# Patient Record
Sex: Female | Born: 2005 | Race: Black or African American | Hispanic: No | Marital: Single | Smoking: Never smoker
Health system: Southern US, Community
[De-identification: ages and names within clinical notes are randomized; demographics above are authoritative.]

## PROBLEM LIST (undated history)

## (undated) DIAGNOSIS — L309 Dermatitis, unspecified: Principal | ICD-10-CM

## (undated) DIAGNOSIS — J45909 Unspecified asthma, uncomplicated: Secondary | ICD-10-CM

## (undated) DIAGNOSIS — N946 Dysmenorrhea, unspecified: Secondary | ICD-10-CM

## (undated) HISTORY — DX: Unspecified asthma, uncomplicated: J45.909

## (undated) HISTORY — PX: WISDOM TOOTH EXTRACTION: SHX21

## (undated) HISTORY — DX: Dermatitis, unspecified: L30.9

## (undated) HISTORY — DX: Dysmenorrhea, unspecified: N94.6

---
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
## 2023-03-24 ENCOUNTER — Encounter: Payer: Medicaid Other | Admitting: Advanced Practice Midwife

## 2023-03-29 DIAGNOSIS — J209 Acute bronchitis, unspecified: Secondary | ICD-10-CM | POA: Diagnosis not present

## 2023-03-29 DIAGNOSIS — J101 Influenza due to other identified influenza virus with other respiratory manifestations: Secondary | ICD-10-CM | POA: Diagnosis not present

## 2023-03-29 DIAGNOSIS — J4521 Mild intermittent asthma with (acute) exacerbation: Secondary | ICD-10-CM | POA: Diagnosis not present

## 2023-03-29 DIAGNOSIS — Z1152 Encounter for screening for COVID-19: Secondary | ICD-10-CM | POA: Diagnosis not present

## 2023-04-26 ENCOUNTER — Encounter: Payer: Medicaid Other | Admitting: Adult Health

## 2023-06-29 ENCOUNTER — Ambulatory Visit

## 2023-07-21 DIAGNOSIS — N766 Ulceration of vulva: Secondary | ICD-10-CM | POA: Diagnosis not present

## 2023-07-21 DIAGNOSIS — A5901 Trichomonal vulvovaginitis: Secondary | ICD-10-CM | POA: Diagnosis not present

## 2023-07-21 DIAGNOSIS — L989 Disorder of the skin and subcutaneous tissue, unspecified: Secondary | ICD-10-CM | POA: Diagnosis not present

## 2023-07-21 DIAGNOSIS — B3731 Acute candidiasis of vulva and vagina: Secondary | ICD-10-CM | POA: Diagnosis not present

## 2023-07-21 DIAGNOSIS — Z113 Encounter for screening for infections with a predominantly sexual mode of transmission: Secondary | ICD-10-CM | POA: Diagnosis not present

## 2023-07-21 DIAGNOSIS — R300 Dysuria: Secondary | ICD-10-CM | POA: Diagnosis not present

## 2023-07-21 DIAGNOSIS — Z3201 Encounter for pregnancy test, result positive: Secondary | ICD-10-CM | POA: Diagnosis not present

## 2023-07-31 DIAGNOSIS — O26891 Other specified pregnancy related conditions, first trimester: Secondary | ICD-10-CM | POA: Diagnosis not present

## 2023-07-31 DIAGNOSIS — Z3A01 Less than 8 weeks gestation of pregnancy: Secondary | ICD-10-CM | POA: Diagnosis not present

## 2023-07-31 DIAGNOSIS — R1030 Lower abdominal pain, unspecified: Secondary | ICD-10-CM | POA: Diagnosis not present

## 2023-08-11 ENCOUNTER — Other Ambulatory Visit: Payer: Self-pay | Admitting: Obstetrics & Gynecology

## 2023-08-11 DIAGNOSIS — O3680X0 Pregnancy with inconclusive fetal viability, not applicable or unspecified: Secondary | ICD-10-CM

## 2023-08-15 ENCOUNTER — Ambulatory Visit

## 2023-08-15 DIAGNOSIS — Z3491 Encounter for supervision of normal pregnancy, unspecified, first trimester: Secondary | ICD-10-CM

## 2023-08-15 DIAGNOSIS — O3680X0 Pregnancy with inconclusive fetal viability, not applicable or unspecified: Secondary | ICD-10-CM

## 2023-08-15 DIAGNOSIS — Z3A01 Less than 8 weeks gestation of pregnancy: Secondary | ICD-10-CM | POA: Diagnosis not present

## 2023-08-15 NOTE — Progress Notes (Signed)
 US  7+6 wks,single IUP with yolk sac,CRL 15.13 mm,normal ovaries,FHR 166 bpm

## 2023-09-05 ENCOUNTER — Telehealth: Payer: Self-pay | Admitting: *Deleted

## 2023-09-05 DIAGNOSIS — Z3400 Encounter for supervision of normal first pregnancy, unspecified trimester: Secondary | ICD-10-CM | POA: Insufficient documentation

## 2023-09-05 NOTE — Telephone Encounter (Signed)
 Pt has a sharp pain in right side of stomach. Pain started this am around 8:00 am. Pt had a BM this morning. Pt is early pregnant. Pt was advised dehydration can cause discomfort. Pt was advised to increase water intake to 6-8 bottles of water a day. If pain don't go away or if it gets worse, let us  know. Pt voiced understanding. JSY

## 2023-09-06 ENCOUNTER — Encounter: Admitting: *Deleted

## 2023-09-06 ENCOUNTER — Ambulatory Visit (INDEPENDENT_AMBULATORY_CARE_PROVIDER_SITE_OTHER): Admitting: Women's Health

## 2023-09-06 ENCOUNTER — Encounter: Payer: Self-pay | Admitting: Women's Health

## 2023-09-06 ENCOUNTER — Other Ambulatory Visit (HOSPITAL_COMMUNITY)
Admission: RE | Admit: 2023-09-06 | Discharge: 2023-09-06 | Disposition: A | Discharge status: Home / Self Care | Source: Ambulatory Visit | Attending: Women's Health | Admitting: Women's Health

## 2023-09-06 VITALS — BP 120/86 | HR 84 | Ht 66.0 in | Wt 191.4 lb

## 2023-09-06 DIAGNOSIS — Z3A11 11 weeks gestation of pregnancy: Secondary | ICD-10-CM

## 2023-09-06 DIAGNOSIS — Z131 Encounter for screening for diabetes mellitus: Secondary | ICD-10-CM

## 2023-09-06 DIAGNOSIS — Z1332 Encounter for screening for maternal depression: Secondary | ICD-10-CM

## 2023-09-06 DIAGNOSIS — Z3401 Encounter for supervision of normal first pregnancy, first trimester: Secondary | ICD-10-CM | POA: Diagnosis not present

## 2023-09-06 DIAGNOSIS — Z6831 Body mass index (BMI) 31.0-31.9, adult: Secondary | ICD-10-CM

## 2023-09-06 DIAGNOSIS — A599 Trichomoniasis, unspecified: Secondary | ICD-10-CM | POA: Diagnosis not present

## 2023-09-06 DIAGNOSIS — B009 Herpesviral infection, unspecified: Secondary | ICD-10-CM | POA: Diagnosis not present

## 2023-09-06 MED ORDER — PROMETHAZINE HCL 25 MG PO TABS: 12.5000 mg | ORAL_TABLET | Freq: Four times a day (QID) | ORAL | 6 refills | Status: DC | PRN

## 2023-09-06 MED ORDER — ASPIRIN 81 MG PO TBEC: 81.0000 mg | DELAYED_RELEASE_TABLET | Freq: Every day | ORAL | 3 refills | Status: AC

## 2023-09-06 MED ORDER — BLOOD PRESSURE MONITOR MISC: 0 refills | Status: AC

## 2023-09-06 NOTE — Progress Notes (Signed)
 INITIAL OBSTETRICAL VISIT Patient name: Miranda White MRN [id number]  Date of birth: Oct 21, 2005 Chief Complaint:   Initial Prenatal Visit (nausea)  History of Present Illness:   Miranda White is a 18 y.o. G1P0 African-American female at [redacted]w[redacted]d by US  at 7 weeks with an Estimated Date of Delivery: 03/27/24 being seen today for her initial obstetrical visit.   Patient's last menstrual period was 06/28/2023. Her obstetrical history is significant for primigravida.   Today she reports nausea, occ vomiting- requests prn meds.  Last pap <21yo. Results were: N/A     09/06/2023    3:30 PM 04/20/2022    9:25 AM  Depression screen PHQ 2/9  Decreased Interest 0 0  Down, Depressed, Hopeless 0 1  PHQ - 2 Score 0 1  Altered sleeping 1 0  Tired, decreased energy 1 0  Change in appetite 0 0  Feeling bad or failure about yourself  0 0  Trouble concentrating 0 0  Moving slowly or fidgety/restless 0 0  Suicidal thoughts 0   PHQ-9 Score 2 1        09/06/2023    3:30 PM  GAD 7 : Generalized Anxiety Score  Nervous, Anxious, on Edge 1  Control/stop worrying 0  Worry too much - different things 1  Trouble relaxing 0  Restless 0  Easily annoyed or irritable 1  Afraid - awful might happen 0  Total GAD 7 Score 3     Review of Systems:   Pertinent items are noted in HPI Denies cramping/contractions, leakage of fluid, vaginal bleeding, abnormal vaginal discharge w/ itching/odor/irritation, headaches, visual changes, shortness of breath, chest pain, abdominal pain, severe nausea/vomiting, or problems with urination or bowel movements unless otherwise stated above.  Pertinent History Reviewed:  Reviewed past medical,surgical, social, obstetrical and family history.  Reviewed problem list, medications and allergies. OB History  Gravida Para Term Preterm AB Living  1       SAB IAB Ectopic Multiple Live Births          # Outcome Date GA Lbr Len/2nd Weight Sex Type Anes PTL Lv   1 Current            Physical Assessment:   Vitals:   09/05/23 1612 09/06/23 1529  BP:  120/86  Pulse:  84  Weight:  191 lb 6.4 oz (86.8 kg)  Height: 5' 6 (1.676 m)   Body mass index is 30.89 kg/m.       Physical Examination:  General appearance - well appearing, and in no distress  Mental status - alert, oriented to person, place, and time  Psych:  She has a normal mood and affect  Skin - warm and dry, normal color, no suspicious lesions noted  Chest - effort normal, all lung fields clear to auscultation bilaterally  Heart - normal rate and regular rhythm  Abdomen - soft, nontender  Extremities:  No swelling or varicosities noted  Thin prep pap is not done   Chaperone: N/A  TODAY'S Informal TA u/s: +FCA and active fetus  No results found for this or any previous visit (from the past 24 hours).  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 Swallow whole.    Dispense:  90 tablet    Refill:  3   promethazine  (PHENERGAN ) 25 MG tablet    Sig: Take 0.5-1 tablets (12.5-25 mg total) by mouth every 6 (six) hours as needed.    Dispense:  30 tablet    Refill:  6    Initial labs obtained Continue prenatal vitamins Reviewed n/v relief measures and warning s/s to report Reviewed recommended weight gain based on pre-gravid BMI Encouraged well-balanced diet Genetic & carrier screening discussed: requests Panorama, declines NT/IT, AFP, and Horizon  Ultrasound discussed; fetal  survey: requested CCNC completed> form faxed if has or is planning to apply for medicaid The nature of [facility] [employer] with multiple MDs and other Advanced Practice Providers was explained to patient; also emphasized that fellows, residents, and students are part of our team. Does not have home bp cuff. Office bp cuff given: no. Rx sent: yes. Check bp weekly, let us  know if consistently >140/90.   Indications for ASA therapy (per uptodate) OR Two or more of the following: Nulliparity Yes Obesity (BMI>30 kg/m2) Yes Sociodemographic characteristics (African American race, low socioeconomic level) Yes  Follow-up: Return in about 4 weeks (around 10/04/2023) for LROB, CNM, in person; then 9wks from now anatomy u/s and LROB w/ CNM.   Orders Placed This Encounter  Procedures   Urine Culture   PANORAMA PRENATAL TEST   Hemoglobin A1c   CBC/D/Plt+RPR+Rh+ABO+RubIgG...   Hgb [address] [place], [facility] 09/06/2023 4:18 PM

## 2023-09-06 NOTE — Patient Instructions (Signed)
 Miranda White, thank you for choosing our office today! We appreciate the opportunity to meet your healthcare needs. You may receive a short survey by mail, e-mail, or through [employer]. If you are happy with your care we would appreciate if you could take just a few minutes to complete the survey questions. We read all of your comments and take your feedback very seriously. Thank you again for choosing our office.  Center for [employer] [facility] Team at [facility]  [facility] at [facility] ([address] [place], [place] [address]) Entrance C, located off of [address] Free 24/7 valet parking   Nausea & Vomiting Have saltine crackers or pretzels by your bed and eat a few bites before you raise your head out of bed in the morning Eat small frequent meals throughout the day instead of large meals Drink plenty of fluids throughout the day to stay hydrated, just don't drink a lot of fluids with your meals.  This can make your stomach fill up faster making you feel sick Do not brush your teeth right after you eat Products with real ginger are good for nausea, like ginger ale and ginger hard candy Make sure it says made with real ginger! Sucking on sour candy like lemon heads is also good for nausea If your prenatal vitamins make you nauseated, take them at night so you will sleep through the nausea Sea Bands If you feel like you need medicine for the nausea & vomiting please let us  know If you are unable to keep any fluids or food down please let us  know   Constipation Drink plenty of fluid, preferably water, throughout the day Eat foods high in fiber such as fruits, vegetables, and grains Exercise, such as walking, is a good way to keep your bowels regular Drink warm fluids, especially warm prune juice, or decaf coffee Eat a 1/2 cup of real oatmeal (not instant), 1/2 cup applesauce, and 1/2-1 cup warm prune juice every day If needed, you may take Colace (docusate sodium) stool  softener once or twice a day to help keep the stool soft.  If you still are having problems with constipation, you may take Miralax  once daily as needed to help keep your bowels regular.   Home Blood Pressure Monitoring for Patients   Your provider has recommended that you check your blood pressure (BP) at least once a week at home. If you do not have a blood pressure cuff at home, one will be provided for you. Contact your provider if you have not received your monitor within 1 week.   Helpful Tips for Accurate Home Blood Pressure Checks  Don't smoke, exercise, or drink caffeine 30 minutes before checking your BP Use the restroom before checking your BP (a full bladder can raise your pressure) Relax in a comfortable upright chair Feet on the ground Left arm resting comfortably on a flat surface at the level of your heart Legs uncrossed Back supported Sit quietly and don't talk Place the cuff on your bare arm Adjust snuggly, so that only two fingertips can fit between your skin and the top of the cuff Check 2 readings separated by at least one minute Keep a log of your BP readings For a visual, please reference this diagram: http://ccnc.care/bpdiagram  Provider Name: [facility]     Phone: [phone]  Zone 1: ALL CLEAR  Continue to monitor your symptoms:  BP reading is less than 140 (top number) or less than 90 (bottom  number)  No right upper stomach pain No headaches or seeing spots No feeling nauseated or throwing up No swelling in face and hands  Zone 2: CAUTION Call your doctor's office for any of the following:  BP reading is greater than 140 (top number) or greater than 90 (bottom number)  Stomach pain under your ribs in the middle or right side Headaches or seeing spots Feeling nauseated or throwing up Swelling in face and hands  Zone 3: EMERGENCY  Seek immediate medical care if you have any of the following:  BP reading is greater than160 (top number) or  greater than 110 (bottom number) Severe headaches not improving with Tylenol Serious difficulty catching your breath Any worsening symptoms from Zone 2    First Trimester of Pregnancy The first trimester of pregnancy is from week 1 until the end of week 12 (months 1 through 3). A week after a sperm fertilizes an egg, the egg will implant on the wall of the uterus. This embryo will begin to develop into a baby. Genes from you and your partner are forming the baby. The female genes determine whether the baby is a boy or a girl. At 6-8 weeks, the eyes and face are formed, and the heartbeat can be seen on ultrasound. At the end of 12 weeks, all the baby's organs are formed.  Now that you are pregnant, you will want to do everything you can to have a healthy baby. Two of the most important things are to get good prenatal care and to follow your health care provider's instructions. Prenatal care is all the medical care you receive before the baby's birth. This care will help prevent, find, and treat any problems during the pregnancy and childbirth. BODY CHANGES Your body goes through many changes during pregnancy. The changes vary from woman to woman.  You may gain or lose a couple of pounds at first. You may feel sick to your stomach (nauseous) and throw up (vomit). If the vomiting is uncontrollable, call your health care provider. You may tire easily. You may develop headaches that can be relieved by medicines approved by your health care provider. You may urinate more often. Painful urination may mean you have a bladder infection. You may develop heartburn as a result of your pregnancy. You may develop constipation because certain hormones are causing the muscles that push waste through your intestines to slow down. You may develop hemorrhoids or swollen, bulging veins (varicose veins). Your breasts may begin to grow larger and become tender. Your nipples may stick out more, and the tissue that  surrounds them (areola) may become darker. Your gums may bleed and may be sensitive to brushing and flossing. Dark spots or blotches (chloasma, mask of pregnancy) may develop on your face. This will likely fade after the baby is born. Your menstrual periods will stop. You may have a loss of appetite. You may develop cravings for certain kinds of food. You may have changes in your emotions from day to day, such as being excited to be pregnant or being concerned that something may go wrong with the pregnancy and baby. You may have more vivid and strange dreams. You may have changes in your hair. These can include thickening of your hair, rapid growth, and changes in texture. Some women also have hair loss during or after pregnancy, or hair that feels dry or thin. Your hair will most likely return to normal after your baby is born. WHAT TO EXPECT AT YOUR PRENATAL  VISITS During a routine prenatal visit: You will be weighed to make sure you and the baby are growing normally. Your blood pressure will be taken. Your abdomen will be measured to track your baby's growth. The fetal heartbeat will be listened to starting around week 10 or 12 of your pregnancy. Test results from any previous visits will be discussed. Your health care provider may ask you: How you are feeling. If you are feeling the baby move. If you have had any abnormal symptoms, such as leaking fluid, bleeding, severe headaches, or abdominal cramping. If you have any questions. Other tests that may be performed during your first trimester include: Blood tests to find your blood type and to check for the presence of any previous infections. They will also be used to check for low iron levels (anemia) and Rh antibodies. Later in the pregnancy, blood tests for diabetes will be done along with other tests if problems develop. Urine tests to check for infections, diabetes, or protein in the urine. An ultrasound to confirm the proper growth  and development of the baby. An amniocentesis to check for possible genetic problems. Fetal screens for spina bifida and Down syndrome. You may need other tests to make sure you and the baby are doing well. HOME CARE INSTRUCTIONS  Medicines Follow your health care provider's instructions regarding medicine use. Specific medicines may be either safe or unsafe to take during pregnancy. Take your prenatal vitamins as directed. If you develop constipation, try taking a stool softener if your health care provider approves. Diet Eat regular, well-balanced meals. Choose a variety of foods, such as meat or vegetable-based protein, fish, milk and low-fat dairy products, vegetables, fruits, and whole grain breads and cereals. Your health care provider will help you determine the amount of weight gain that is right for you. Avoid raw meat and uncooked cheese. These carry germs that can cause birth defects in the baby. Eating four or five small meals rather than three large meals a day may help relieve nausea and vomiting. If you start to feel nauseous, eating a few soda crackers can be helpful. Drinking liquids between meals instead of during meals also seems to help nausea and vomiting. If you develop constipation, eat more high-fiber foods, such as fresh vegetables or fruit and whole grains. Drink enough fluids to keep your urine clear or pale yellow. Activity and Exercise Exercise only as directed by your health care provider. Exercising will help you: Control your weight. Stay in shape. Be prepared for labor and delivery. Experiencing pain or cramping in the lower abdomen or low back is a good sign that you should stop exercising. Check with your health care provider before continuing normal exercises. Try to avoid standing for long periods of time. Move your legs often if you must stand in one place for a long time. Avoid heavy lifting. Wear low-heeled shoes, and practice good posture. You may  continue to have sex unless your health care provider directs you otherwise. Relief of Pain or Discomfort Wear a good support bra for breast tenderness.   Take warm sitz baths to soothe any pain or discomfort caused by hemorrhoids. Use hemorrhoid cream if your health care provider approves.   Rest with your legs elevated if you have leg cramps or low back pain. If you develop varicose veins in your legs, wear support hose. Elevate your feet for 15 minutes, 3-4 times a day. Limit salt in your diet. Prenatal Care Schedule your prenatal visits by the  twelfth week of pregnancy. They are usually scheduled monthly at first, then more often in the last 2 months before delivery. Write down your questions. Take them to your prenatal visits. Keep all your prenatal visits as directed by your health care provider. Safety Wear your seat belt at all times when driving. Make a list of emergency phone numbers, including numbers for family, friends, the hospital, and police and fire departments. General Tips Ask your health care provider for a referral to a local prenatal education class. Begin classes no later than at the beginning of month 6 of your pregnancy. Ask for help if you have counseling or nutritional needs during pregnancy. Your health care provider can offer advice or refer you to specialists for help with various needs. Do not use hot tubs, steam rooms, or saunas. Do not douche or use tampons or scented sanitary pads. Do not cross your legs for long periods of time. Avoid cat litter boxes and soil used by cats. These carry germs that can cause birth defects in the baby and possibly loss of the fetus by miscarriage or stillbirth. Avoid all smoking, herbs, alcohol, and medicines not prescribed by your health care provider. Chemicals in these affect the formation and growth of the baby. Schedule a dentist appointment. At home, brush your teeth with a soft toothbrush and be gentle when you floss. SEEK  MEDICAL CARE IF:  You have dizziness. You have mild pelvic cramps, pelvic pressure, or nagging pain in the abdominal area. You have persistent nausea, vomiting, or diarrhea. You have a bad smelling vaginal discharge. You have pain with urination. You notice increased swelling in your face, hands, legs, or ankles. SEEK IMMEDIATE MEDICAL CARE IF:  You have a fever. You are leaking fluid from your vagina. You have spotting or bleeding from your vagina. You have severe abdominal cramping or pain. You have rapid weight gain or loss. You vomit blood or material that looks like coffee grounds. You are exposed to [place] measles and have never had them. You are exposed to fifth disease or chickenpox. You develop a severe headache. You have shortness of breath. You have any kind of trauma, such as from a fall or a car accident. Document Released: 01/19/2001 Document Revised: 06/11/2013 Document Reviewed: 12/05/2012 [facility] Patient Information 2015 [place], [place]. This information is not intended to replace advice given to you by your health care provider. Make sure you discuss any questions you have with your health care provider.

## 2023-09-07 ENCOUNTER — Encounter: Payer: Self-pay | Admitting: Women's Health

## 2023-09-07 DIAGNOSIS — N390 Urinary tract infection, site not specified: Secondary | ICD-10-CM | POA: Insufficient documentation

## 2023-09-08 LAB — HGB FRACTIONATION CASCADE
Hgb A2: 2.5 (ref 1.8–3.2)
Hgb A: 97.5 (ref 96.4–98.8)
Hgb F: 0 (ref 0.0–2.0)
Hgb S: 0

## 2023-09-08 LAB — CERVICOVAGINAL ANCILLARY ONLY
Chlamydia: POSITIVE — AB
Comment: NEGATIVE
Comment: NEGATIVE
Comment: NORMAL
Neisseria Gonorrhea: NEGATIVE
Trichomonas: POSITIVE — AB

## 2023-09-08 LAB — HEMOGLOBIN A1C
Est. average glucose Bld gHb Est-mCnc: 111 mg/dL
Hgb A1c MFr Bld: 5.5 % (ref 4.8–5.6)

## 2023-09-08 LAB — CBC/D/PLT+RPR+RH+ABO+RUBIGG...
Antibody Screen: NEGATIVE
Basophils Absolute: 0.1 x10E3/uL (ref 0.0–0.2)
Basos: 1 %
EOS (ABSOLUTE): 0.1 x10E3/uL (ref 0.0–0.4)
Eos: 1 %
HCV Ab: NONREACTIVE
HIV Screen 4th Generation wRfx: NONREACTIVE
Hematocrit: 39.7 % (ref 34.0–46.6)
Hemoglobin: 12.7 g/dL (ref 11.1–15.9)
Hepatitis B Surface Ag: NEGATIVE
Immature Grans (Abs): 0 x10E3/uL (ref 0.0–0.1)
Immature Granulocytes: 0 %
Lymphocytes Absolute: 3.2 x10E3/uL — ABNORMAL HIGH (ref 0.7–3.1)
Lymphs: 34 %
MCH: 30 pg (ref 26.6–33.0)
MCHC: 32 g/dL (ref 31.5–35.7)
MCV: 94 fL (ref 79–97)
Monocytes Absolute: 0.8 x10E3/uL (ref 0.1–0.9)
Monocytes: 9 %
Neutrophils Absolute: 5.3 x10E3/uL (ref 1.4–7.0)
Neutrophils: 55 %
Platelets: 411 x10E3/uL (ref 150–450)
RBC: 4.23 x10E6/uL (ref 3.77–5.28)
RDW: 12.9 % (ref 11.7–15.4)
RPR Ser Ql: NONREACTIVE
Rh Factor: POSITIVE
Rubella Antibodies, IGG: 4.14 {index} (ref >0.99)
WBC: 9.6 x10E3/uL (ref 3.4–10.8)

## 2023-09-08 LAB — URINE CULTURE

## 2023-09-08 LAB — HCV INTERPRETATION

## 2023-09-09 ENCOUNTER — Ambulatory Visit: Payer: Self-pay | Admitting: Adult Health

## 2023-09-09 DIAGNOSIS — Z3401 Encounter for supervision of normal first pregnancy, first trimester: Secondary | ICD-10-CM

## 2023-09-12 DIAGNOSIS — Z3480 Encounter for supervision of other normal pregnancy, unspecified trimester: Secondary | ICD-10-CM | POA: Diagnosis not present

## 2023-09-12 LAB — PANORAMA PRENATAL TEST FULL PANEL:PANORAMA TEST PLUS 5 ADDITIONAL MICRODELETIONS: FETAL FRACTION: 3.6

## 2023-10-01 DIAGNOSIS — R079 Chest pain, unspecified: Secondary | ICD-10-CM | POA: Diagnosis not present

## 2023-10-01 DIAGNOSIS — R42 Dizziness and giddiness: Secondary | ICD-10-CM | POA: Diagnosis not present

## 2023-10-01 DIAGNOSIS — Z3A14 14 weeks gestation of pregnancy: Secondary | ICD-10-CM | POA: Diagnosis not present

## 2023-10-01 DIAGNOSIS — R519 Headache, unspecified: Secondary | ICD-10-CM | POA: Diagnosis not present

## 2023-10-01 DIAGNOSIS — R4781 Slurred speech: Secondary | ICD-10-CM | POA: Diagnosis not present

## 2023-10-01 DIAGNOSIS — O26891 Other specified pregnancy related conditions, first trimester: Secondary | ICD-10-CM | POA: Diagnosis not present

## 2023-10-01 NOTE — ED Notes (Signed)
 ED Procedure Note  EKG Interpretation  Date/Time: 10/01/2023 9:39 PM  Performed by: [place] Norleen Agent, MD Authorized by: [place] Norleen Agent, MD   ECG interpreted by ED Physician in the absence of a cardiologist: yes   Previous ECG:    Previous ECG:  Unavailable Interpretation:    Interpretation: normal   Rate:    ECG rate:  95   ECG rate assessment: normal   Rhythm:    Rhythm: sinus rhythm   Ectopy:    Ectopy: none   QRS:    QRS axis:  Normal   QRS intervals:  Normal   QRS conduction: normal   ST segments:    ST segments:  Normal T waves:    T waves: normal   Q waves:    Abnormal Q-waves: not present   Comments:     Sinus rhythm at 95 RSR prime pattern no acute ST-T wave changes normal intervals

## 2023-10-04 ENCOUNTER — Encounter: Payer: Self-pay | Admitting: Women's Health

## 2023-10-04 ENCOUNTER — Ambulatory Visit: Admitting: Women's Health

## 2023-10-04 VITALS — BP 117/77 | HR 66 | Wt 193.0 lb

## 2023-10-04 DIAGNOSIS — A749 Chlamydial infection, unspecified: Secondary | ICD-10-CM | POA: Diagnosis not present

## 2023-10-04 DIAGNOSIS — Z3402 Encounter for supervision of normal first pregnancy, second trimester: Secondary | ICD-10-CM | POA: Diagnosis not present

## 2023-10-04 DIAGNOSIS — A599 Trichomoniasis, unspecified: Secondary | ICD-10-CM

## 2023-10-04 DIAGNOSIS — Z363 Encounter for antenatal screening for malformations: Secondary | ICD-10-CM

## 2023-10-04 DIAGNOSIS — Z3A15 15 weeks gestation of pregnancy: Secondary | ICD-10-CM

## 2023-10-04 MED ORDER — METRONIDAZOLE 500 MG PO TABS
500.0000 mg | ORAL_TABLET | Freq: Two times a day (BID) | ORAL | 0 refills | Status: DC
Start: 2023-10-04 — End: 2023-11-03

## 2023-10-04 MED ORDER — AZITHROMYCIN 500 MG PO TABS: 1000.0000 mg | ORAL_TABLET | Freq: Once | ORAL | 0 refills | Status: AC

## 2023-10-04 NOTE — Progress Notes (Signed)
 LOW-RISK PREGNANCY VISIT Patient name: Miranda White MRN [id number]  Date of birth: 2005-11-06 Chief Complaint:   Routine Prenatal Visit  History of Present Illness:   Miranda White is a 18 y.o. G1P0 female at [redacted]w[redacted]d with an Estimated Date of Delivery: 03/27/24 being seen today for ongoing management of a low-risk pregnancy.   Today she reports didn't take meds for CT/trich. No longer w/ partner, did tell him she was + for both- says he made appt for tx. Contractions: Not present. Vag. Bleeding: None.  Movement: Present. denies leaking of fluid.     09/06/2023    3:30 PM 04/20/2022    9:25 AM  Depression screen PHQ 2/9  Decreased Interest 0 0  Down, Depressed, Hopeless 0 1  PHQ - 2 Score 0 1  Altered sleeping 1 0  Tired, decreased energy 1 0  Change in appetite 0 0  Feeling bad or failure about yourself  0 0  Trouble concentrating 0 0  Moving slowly or fidgety/restless 0 0  Suicidal thoughts 0   PHQ-9 Score 2 1        09/06/2023    3:30 PM  GAD 7 : Generalized Anxiety Score  Nervous, Anxious, on Edge 1  Control/stop worrying 0  Worry too much - different things 1  Trouble relaxing 0  Restless 0  Easily annoyed or irritable 1  Afraid - awful might happen 0  Total GAD 7 Score 3      Review of Systems:   Pertinent items are noted in HPI Denies abnormal vaginal discharge w/ itching/odor/irritation, headaches, visual changes, shortness of breath, chest pain, abdominal pain, severe nausea/vomiting, or problems with urination or bowel movements unless otherwise stated above. Pertinent History Reviewed:  Reviewed past medical,surgical, social, obstetrical and family history.  Reviewed problem list, medications and allergies. Physical Assessment:   Vitals:   10/04/23 1354  BP: 117/77  Pulse: 66  Weight: 193 lb (87.5 kg)  Body mass index is 31.15 kg/m.        Physical Examination:   General appearance: Well appearing, and in no distress  Mental  status: Alert, oriented to person, place, and time  Skin: Warm & dry  Cardiovascular: Normal heart rate noted  Respiratory: Normal respiratory effort, no distress  Abdomen: Soft, gravid, nontender  Pelvic: Cervical exam deferred         Extremities:    Fetal Status: Fetal Heart Rate (bpm): 153   Movement: Present    Chaperone: N/A No results found for this or any previous visit (from the past 24 hours).  Assessment & Plan:  1) Low-risk pregnancy G1P0 at [redacted]w[redacted]d with an Estimated Date of Delivery: 03/27/24   2) Recent +CT and trich, didn't get meds (said pharmacy didn't fill), new rx's sent, partner made appt for tx (no longer together), plan POC in 4wks/upcoming appt   Meds:  Meds ordered this encounter  Medications   azithromycin  (ZITHROMAX ) 500 MG tablet    Sig: Take 2 tablets (1,000 mg total) by mouth once for 1 dose.    Dispense:  2 tablet    Refill:  0   metroNIDAZOLE  (FLAGYL ) 500 MG tablet    Sig: Take 1 tablet (500 mg total) by mouth 2 (two) times daily.    Dispense:  14 tablet    Refill:  0   Labs/procedures today: none  Plan:  Continue routine obstetrical care  Next visit: prefers will be in person for u/s    Reviewed:  Preterm labor symptoms and general obstetric precautions including but not limited to vaginal bleeding, contractions, leaking of fluid and fetal movement were reviewed in detail with the patient.  All questions were answered. Does have home bp cuff. Office bp cuff given: not applicable. Check bp weekly, let us  know if consistently >140 and/or >90.  Follow-up: Return for As scheduled.  Future Appointments  Date Time Provider Department Center  11/03/2023  2:15 PM [facility]  [facility] None  11/03/2023  3:10 PM Kizzie Suzen SAUNDERS, CNM [facility]    Orders Placed This Encounter  Procedures   US  OB Comp + [address] CNM, [facility] 10/04/2023 2:24 PM

## 2023-10-04 NOTE — Patient Instructions (Signed)
 Miranda White, thank you for choosing our office today! We appreciate the opportunity to meet your healthcare needs. You may receive a short survey by mail, e-mail, or through [employer]. If you are happy with your care we would appreciate if you could take just a few minutes to complete the survey questions. We read all of your comments and take your feedback very seriously. Thank you again for choosing our office.  Center for [employer] Team at [facility] at [facility] ([address] [place], [place] [address]) Entrance C, located off of [address] Free 24/7 valet parking  Go to [email or web address] to register for FREE online childbirth classes  Call the office [phone]) or go to [facility] if: You begin to severe cramping Your water breaks.  Sometimes it is a big gush of fluid, sometimes it is just a trickle that keeps getting your panties wet or running down your legs You have vaginal bleeding.  It is normal to have a small amount of spotting if your cervix was checked.   [facility] Pediatricians/Family Doctors [facility]): [address] [place], [phone]           [facility]: [address], [phone]                [facility]): [address], [phone] (call to ask if accepting patients) [facility] Department: [address], [place], [phone]    [facility] Pediatricians/Family Doctors [facility]): [phone] [address], [phone] [facility]: [address] [place], [phone] [facility]: [address], [phone]  [facility] Doctors  [facility]): [phone] [facility]: [address], [phone]   [facility]: [address], [phone]  [facility] Doctors  [facility]: [phone], [phone]  Home Blood Pressure Monitoring for Patients   Your provider has recommended that you check your blood pressure (BP) at least once a week at home. If you do not have a blood pressure cuff at home, one will be provided for you. Contact your provider if you have not received your monitor within 1 week.   Helpful Tips for Accurate Home Blood Pressure Checks  Don't smoke, exercise, or drink caffeine 30 minutes before checking your BP Use the restroom before checking your BP (a full bladder can raise your pressure) Relax in a comfortable upright chair Feet on the ground Left arm resting comfortably on a flat surface at the level of your heart Legs uncrossed Back supported Sit quietly and don't talk Place the cuff on your bare arm Adjust snuggly, so that only two fingertips can fit between your skin and the top of the cuff Check 2 readings separated by at least one minute Keep a log of your BP readings For a visual, please reference this diagram: http://ccnc.care/bpdiagram  Provider Name: [facility]     Phone: [phone]  Zone 1: ALL CLEAR  Continue to monitor your symptoms:  BP reading is less than 140 (top number) or less than 90 (bottom number)  No right upper stomach pain No headaches or seeing spots No feeling nauseated or throwing up No swelling in face and hands  Zone 2: CAUTION Call your doctor's office for any of the following:  BP reading is greater than 140 (top number) or greater than  90 (bottom number)  Stomach pain under your ribs in the middle or right side Headaches or seeing spots Feeling nauseated or throwing up Swelling in face and hands  Zone 3: EMERGENCY  Seek immediate medical care if you have any of the following:  BP reading is greater than160 (top number) or greater than 110 (bottom number) Severe headaches not improving with Tylenol Serious difficulty catching your breath Any worsening symptoms from  Zone 2     Second Trimester of Pregnancy The second trimester is from week 14 through week 27 (months 4 through 6). The second trimester is often a time when you feel your best. Your body has adjusted to being pregnant, and you begin to feel better physically. Usually, morning sickness has lessened or quit completely, you may have more energy, and you may have an increase in appetite. The second trimester is also a time when the fetus is growing rapidly. At the end of the sixth month, the fetus is about 9 inches long and weighs about 1 pounds. You will likely begin to feel the baby move (quickening) between 16 and 20 weeks of pregnancy. Body changes during your second trimester Your body continues to go through many changes during your second trimester. The changes vary from woman to woman. Your weight will continue to increase. You will notice your lower abdomen bulging out. You may begin to get stretch marks on your hips, abdomen, and breasts. You may develop headaches that can be relieved by medicines. The medicines should be approved by your health care provider. You may urinate more often because the fetus is pressing on your bladder. You may develop or continue to have heartburn as a result of your pregnancy. You may develop constipation because certain hormones are causing the muscles that push waste through your intestines to slow down. You may develop hemorrhoids or swollen, bulging veins (varicose veins). You may have back pain. This is caused by: Weight gain. Pregnancy hormones that are relaxing the joints in your pelvis. A shift in weight and the muscles that support your balance. Your breasts will continue to grow and they will continue to become tender. Your gums may bleed and may be sensitive to brushing and flossing. Dark spots or blotches (chloasma, mask of pregnancy) may develop on your face. This will likely fade after the baby is born. A dark line from your belly button to  the pubic area (linea nigra) may appear. This will likely fade after the baby is born. You may have changes in your hair. These can include thickening of your hair, rapid growth, and changes in texture. Some women also have hair loss during or after pregnancy, or hair that feels dry or thin. Your hair will most likely return to normal after your baby is born.  What to expect at prenatal visits During a routine prenatal visit: You will be weighed to make sure you and the fetus are growing normally. Your blood pressure will be taken. Your abdomen will be measured to track your baby's growth. The fetal heartbeat will be listened to. Any test results from the previous visit will be discussed.  Your health care provider may ask you: How you are feeling. If you are feeling the baby move. If you have had any abnormal symptoms, such as leaking fluid, bleeding, severe headaches, or abdominal cramping. If you are using any tobacco products, including cigarettes, chewing tobacco, and electronic cigarettes. If you have any questions.  Other tests that may be performed during  your second trimester include: Blood tests that check for: Low iron levels (anemia). High blood sugar that affects pregnant women (gestational diabetes) between 92 and 28 weeks. Rh antibodies. This is to check for a protein on red blood cells (Rh factor). Urine tests to check for infections, diabetes, or protein in the urine. An ultrasound to confirm the proper growth and development of the baby. An amniocentesis to check for possible genetic problems. Fetal screens for spina bifida and Down syndrome. HIV (human immunodeficiency virus) testing. Routine prenatal testing includes screening for HIV, unless you choose not to have this test.  Follow these instructions at home: Medicines Follow your health care provider's instructions regarding medicine use. Specific medicines may be either safe or unsafe to take during  pregnancy. Take a prenatal vitamin that contains at least 600 micrograms (mcg) of folic acid. If you develop constipation, try taking a stool softener if your health care provider approves. Eating and drinking Eat a balanced diet that includes fresh fruits and vegetables, whole grains, good sources of protein such as meat, eggs, or tofu, and low-fat dairy. Your health care provider will help you determine the amount of weight gain that is right for you. Avoid raw meat and uncooked cheese. These carry germs that can cause birth defects in the baby. If you have low calcium intake from food, talk to your health care provider about whether you should take a daily calcium supplement. Limit foods that are high in fat and processed sugars, such as fried and sweet foods. To prevent constipation: Drink enough fluid to keep your urine clear or pale yellow. Eat foods that are high in fiber, such as fresh fruits and vegetables, whole grains, and beans. Activity Exercise only as directed by your health care provider. Most women can continue their usual exercise routine during pregnancy. Try to exercise for 30 minutes at least 5 days a week. Stop exercising if you experience uterine contractions. Avoid heavy lifting, wear low heel shoes, and practice good posture. A sexual relationship may be continued unless your health care provider directs you otherwise. Relieving pain and discomfort Wear a good support bra to prevent discomfort from breast tenderness. Take warm sitz baths to soothe any pain or discomfort caused by hemorrhoids. Use hemorrhoid cream if your health care provider approves. Rest with your legs elevated if you have leg cramps or low back pain. If you develop varicose veins, wear support hose. Elevate your feet for 15 minutes, 3-4 times a day. Limit salt in your diet. Prenatal Care Write down your questions. Take them to your prenatal visits. Keep all your prenatal visits as told by your health  care provider. This is important. Safety Wear your seat belt at all times when driving. Make a list of emergency phone numbers, including numbers for family, friends, the hospital, and police and fire departments. General instructions Ask your health care provider for a referral to a local prenatal education class. Begin classes no later than the beginning of month 6 of your pregnancy. Ask for help if you have counseling or nutritional needs during pregnancy. Your health care provider can offer advice or refer you to specialists for help with various needs. Do not use hot tubs, steam rooms, or saunas. Do not douche or use tampons or scented sanitary pads. Do not cross your legs for long periods of time. Avoid cat litter boxes and soil used by cats. These carry germs that can cause birth defects in the baby and possibly loss of the  fetus by miscarriage or stillbirth. Avoid all smoking, herbs, alcohol, and unprescribed drugs. Chemicals in these products can affect the formation and growth of the baby. Do not use any products that contain nicotine or tobacco, such as cigarettes and e-cigarettes. If you need help quitting, ask your health care provider. Visit your dentist if you have not gone yet during your pregnancy. Use a soft toothbrush to brush your teeth and be gentle when you floss. Contact a health care provider if: You have dizziness. You have mild pelvic cramps, pelvic pressure, or nagging pain in the abdominal area. You have persistent nausea, vomiting, or diarrhea. You have a bad smelling vaginal discharge. You have pain when you urinate. Get help right away if: You have a fever. You are leaking fluid from your vagina. You have spotting or bleeding from your vagina. You have severe abdominal cramping or pain. You have rapid weight gain or weight loss. You have shortness of breath with chest pain. You notice sudden or extreme swelling of your face, hands, ankles, feet, or legs. You  have not felt your baby move in over an hour. You have severe headaches that do not go away when you take medicine. You have vision changes. Summary The second trimester is from week 14 through week 27 (months 4 through 6). It is also a time when the fetus is growing rapidly. Your body goes through many changes during pregnancy. The changes vary from woman to woman. Avoid all smoking, herbs, alcohol, and unprescribed drugs. These chemicals affect the formation and growth your baby. Do not use any tobacco products, such as cigarettes, chewing tobacco, and e-cigarettes. If you need help quitting, ask your health care provider. Contact your health care provider if you have any questions. Keep all prenatal visits as told by your health care provider. This is important. This information is not intended to replace advice given to you by your health care provider. Make sure you discuss any questions you have with your health care provider. Document Released: 01/19/2001 Document Revised: 07/03/2015 Document Reviewed: 03/28/2012 Elsevier Interactive Patient Education  2017 [employer].

## 2023-10-17 ENCOUNTER — Encounter: Payer: Self-pay | Admitting: Women's Health

## 2023-10-28 ENCOUNTER — Encounter: Payer: Self-pay | Admitting: *Deleted

## 2023-11-03 ENCOUNTER — Encounter: Payer: Self-pay | Admitting: Women's Health

## 2023-11-03 ENCOUNTER — Other Ambulatory Visit (HOSPITAL_COMMUNITY)
Admission: RE | Admit: 2023-11-03 | Discharge: 2023-11-03 | Disposition: A | Discharge status: Home / Self Care | Source: Ambulatory Visit | Attending: Women's Health | Admitting: Women's Health

## 2023-11-03 ENCOUNTER — Ambulatory Visit

## 2023-11-03 ENCOUNTER — Ambulatory Visit (INDEPENDENT_AMBULATORY_CARE_PROVIDER_SITE_OTHER): Admitting: Women's Health

## 2023-11-03 VITALS — BP 99/66 | HR 77 | Wt 197.0 lb

## 2023-11-03 DIAGNOSIS — Z3402 Encounter for supervision of normal first pregnancy, second trimester: Secondary | ICD-10-CM

## 2023-11-03 DIAGNOSIS — Z3A19 19 weeks gestation of pregnancy: Secondary | ICD-10-CM

## 2023-11-03 DIAGNOSIS — A599 Trichomoniasis, unspecified: Secondary | ICD-10-CM | POA: Insufficient documentation

## 2023-11-03 DIAGNOSIS — A749 Chlamydial infection, unspecified: Secondary | ICD-10-CM | POA: Insufficient documentation

## 2023-11-03 DIAGNOSIS — Z363 Encounter for antenatal screening for malformations: Secondary | ICD-10-CM

## 2023-11-03 NOTE — Progress Notes (Signed)
 US  19+2 wks,cephalic,posterior placenta gr 0,normal ovaries,CX 3.4 cm,SVP of fluid 4 cm,FHR 146 bpm,two LVEICF,EFW 286 g 46%,anatomy complete

## 2023-11-03 NOTE — Patient Instructions (Signed)
 Miranda White, thank you for choosing our office today! We appreciate the opportunity to meet your healthcare needs. You may receive a short survey by mail, e-mail, or through [employer]. If you are happy with your care we would appreciate if you could take just a few minutes to complete the survey questions. We read all of your comments and take your feedback very seriously. Thank you again for choosing our office.  Center for [employer] Team at [facility] at [facility] ([address] [place], [place] [address]) Entrance C, located off of [address] Free 24/7 valet parking  Go to [email or web address] to register for FREE online childbirth classes  Call the office [phone]) or go to [facility] if: You begin to severe cramping Your water breaks.  Sometimes it is a big gush of fluid, sometimes it is just a trickle that keeps getting your panties wet or running down your legs You have vaginal bleeding.  It is normal to have a small amount of spotting if your cervix was checked.   [facility] Pediatricians/Family Doctors [facility]): [address] [place], [phone]           [facility]: [address], [phone]                [facility]): [address], [phone] (call to ask if accepting patients) [facility] Department: [address], [place], [phone]    [facility] Pediatricians/Family Doctors [facility]): [phone] [address], [phone] [facility]: [address] [place], [phone] [facility]: [address], [phone]  [facility] Doctors  [facility]): [phone] [facility]: [address], [phone]   [facility]: [address], [phone]  [facility] Doctors  [facility]: [phone], [phone]  Home Blood Pressure Monitoring for Patients   Your provider has recommended that you check your blood pressure (BP) at least once a week at home. If you do not have a blood pressure cuff at home, one will be provided for you. Contact your provider if you have not received your monitor within 1 week.   Helpful Tips for Accurate Home Blood Pressure Checks  Don't smoke, exercise, or drink caffeine 30 minutes before checking your BP Use the restroom before checking your BP (a full bladder can raise your pressure) Relax in a comfortable upright chair Feet on the ground Left arm resting comfortably on a flat surface at the level of your heart Legs uncrossed Back supported Sit quietly and don't talk Place the cuff on your bare arm Adjust snuggly, so that only two fingertips can fit between your skin and the top of the cuff Check 2 readings separated by at least one minute Keep a log of your BP readings For a visual, please reference this diagram: http://ccnc.care/bpdiagram  Provider Name: [facility]     Phone: [phone]  Zone 1: ALL CLEAR  Continue to monitor your symptoms:  BP reading is less than 140 (top number) or less than 90 (bottom number)  No right upper stomach pain No headaches or seeing spots No feeling nauseated or throwing up No swelling in face and hands  Zone 2: CAUTION Call your doctor's office for any of the following:  BP reading is greater than 140 (top number) or greater than  90 (bottom number)  Stomach pain under your ribs in the middle or right side Headaches or seeing spots Feeling nauseated or throwing up Swelling in face and hands  Zone 3: EMERGENCY  Seek immediate medical care if you have any of the following:  BP reading is greater than160 (top number) or greater than 110 (bottom number) Severe headaches not improving with Tylenol Serious difficulty catching your breath Any worsening symptoms from  Zone 2     Second Trimester of Pregnancy The second trimester is from week 14 through week 27 (months 4 through 6). The second trimester is often a time when you feel your best. Your body has adjusted to being pregnant, and you begin to feel better physically. Usually, morning sickness has lessened or quit completely, you may have more energy, and you may have an increase in appetite. The second trimester is also a time when the fetus is growing rapidly. At the end of the sixth month, the fetus is about 9 inches long and weighs about 1 pounds. You will likely begin to feel the baby move (quickening) between 16 and 20 weeks of pregnancy. Body changes during your second trimester Your body continues to go through many changes during your second trimester. The changes vary from woman to woman. Your weight will continue to increase. You will notice your lower abdomen bulging out. You may begin to get stretch marks on your hips, abdomen, and breasts. You may develop headaches that can be relieved by medicines. The medicines should be approved by your health care provider. You may urinate more often because the fetus is pressing on your bladder. You may develop or continue to have heartburn as a result of your pregnancy. You may develop constipation because certain hormones are causing the muscles that push waste through your intestines to slow down. You may develop hemorrhoids or swollen, bulging veins (varicose veins). You may have back pain. This is caused by: Weight gain. Pregnancy hormones that are relaxing the joints in your pelvis. A shift in weight and the muscles that support your balance. Your breasts will continue to grow and they will continue to become tender. Your gums may bleed and may be sensitive to brushing and flossing. Dark spots or blotches (chloasma, mask of pregnancy) may develop on your face. This will likely fade after the baby is born. A dark line from your belly button to  the pubic area (linea nigra) may appear. This will likely fade after the baby is born. You may have changes in your hair. These can include thickening of your hair, rapid growth, and changes in texture. Some women also have hair loss during or after pregnancy, or hair that feels dry or thin. Your hair will most likely return to normal after your baby is born.  What to expect at prenatal visits During a routine prenatal visit: You will be weighed to make sure you and the fetus are growing normally. Your blood pressure will be taken. Your abdomen will be measured to track your baby's growth. The fetal heartbeat will be listened to. Any test results from the previous visit will be discussed.  Your health care provider may ask you: How you are feeling. If you are feeling the baby move. If you have had any abnormal symptoms, such as leaking fluid, bleeding, severe headaches, or abdominal cramping. If you are using any tobacco products, including cigarettes, chewing tobacco, and electronic cigarettes. If you have any questions.  Other tests that may be performed during  your second trimester include: Blood tests that check for: Low iron levels (anemia). High blood sugar that affects pregnant women (gestational diabetes) between 92 and 28 weeks. Rh antibodies. This is to check for a protein on red blood cells (Rh factor). Urine tests to check for infections, diabetes, or protein in the urine. An ultrasound to confirm the proper growth and development of the baby. An amniocentesis to check for possible genetic problems. Fetal screens for spina bifida and Down syndrome. HIV (human immunodeficiency virus) testing. Routine prenatal testing includes screening for HIV, unless you choose not to have this test.  Follow these instructions at home: Medicines Follow your health care provider's instructions regarding medicine use. Specific medicines may be either safe or unsafe to take during  pregnancy. Take a prenatal vitamin that contains at least 600 micrograms (mcg) of folic acid. If you develop constipation, try taking a stool softener if your health care provider approves. Eating and drinking Eat a balanced diet that includes fresh fruits and vegetables, whole grains, good sources of protein such as meat, eggs, or tofu, and low-fat dairy. Your health care provider will help you determine the amount of weight gain that is right for you. Avoid raw meat and uncooked cheese. These carry germs that can cause birth defects in the baby. If you have low calcium intake from food, talk to your health care provider about whether you should take a daily calcium supplement. Limit foods that are high in fat and processed sugars, such as fried and sweet foods. To prevent constipation: Drink enough fluid to keep your urine clear or pale yellow. Eat foods that are high in fiber, such as fresh fruits and vegetables, whole grains, and beans. Activity Exercise only as directed by your health care provider. Most women can continue their usual exercise routine during pregnancy. Try to exercise for 30 minutes at least 5 days a week. Stop exercising if you experience uterine contractions. Avoid heavy lifting, wear low heel shoes, and practice good posture. A sexual relationship may be continued unless your health care provider directs you otherwise. Relieving pain and discomfort Wear a good support bra to prevent discomfort from breast tenderness. Take warm sitz baths to soothe any pain or discomfort caused by hemorrhoids. Use hemorrhoid cream if your health care provider approves. Rest with your legs elevated if you have leg cramps or low back pain. If you develop varicose veins, wear support hose. Elevate your feet for 15 minutes, 3-4 times a day. Limit salt in your diet. Prenatal Care Write down your questions. Take them to your prenatal visits. Keep all your prenatal visits as told by your health  care provider. This is important. Safety Wear your seat belt at all times when driving. Make a list of emergency phone numbers, including numbers for family, friends, the hospital, and police and fire departments. General instructions Ask your health care provider for a referral to a local prenatal education class. Begin classes no later than the beginning of month 6 of your pregnancy. Ask for help if you have counseling or nutritional needs during pregnancy. Your health care provider can offer advice or refer you to specialists for help with various needs. Do not use hot tubs, steam rooms, or saunas. Do not douche or use tampons or scented sanitary pads. Do not cross your legs for long periods of time. Avoid cat litter boxes and soil used by cats. These carry germs that can cause birth defects in the baby and possibly loss of the  fetus by miscarriage or stillbirth. Avoid all smoking, herbs, alcohol, and unprescribed drugs. Chemicals in these products can affect the formation and growth of the baby. Do not use any products that contain nicotine or tobacco, such as cigarettes and e-cigarettes. If you need help quitting, ask your health care provider. Visit your dentist if you have not gone yet during your pregnancy. Use a soft toothbrush to brush your teeth and be gentle when you floss. Contact a health care provider if: You have dizziness. You have mild pelvic cramps, pelvic pressure, or nagging pain in the abdominal area. You have persistent nausea, vomiting, or diarrhea. You have a bad smelling vaginal discharge. You have pain when you urinate. Get help right away if: You have a fever. You are leaking fluid from your vagina. You have spotting or bleeding from your vagina. You have severe abdominal cramping or pain. You have rapid weight gain or weight loss. You have shortness of breath with chest pain. You notice sudden or extreme swelling of your face, hands, ankles, feet, or legs. You  have not felt your baby move in over an hour. You have severe headaches that do not go away when you take medicine. You have vision changes. Summary The second trimester is from week 14 through week 27 (months 4 through 6). It is also a time when the fetus is growing rapidly. Your body goes through many changes during pregnancy. The changes vary from woman to woman. Avoid all smoking, herbs, alcohol, and unprescribed drugs. These chemicals affect the formation and growth your baby. Do not use any tobacco products, such as cigarettes, chewing tobacco, and e-cigarettes. If you need help quitting, ask your health care provider. Contact your health care provider if you have any questions. Keep all prenatal visits as told by your health care provider. This is important. This information is not intended to replace advice given to you by your health care provider. Make sure you discuss any questions you have with your health care provider. Document Released: 01/19/2001 Document Revised: 07/03/2015 Document Reviewed: 03/28/2012 Elsevier Interactive Patient Education  2017 [employer].

## 2023-11-03 NOTE — Progress Notes (Signed)
 LOW-RISK PREGNANCY VISIT Patient name: Miranda White MRN [id number]  Date of birth: 2005/09/27 Chief Complaint:   Routine Prenatal Visit (Anatomy scan)  History of Present Illness:   Miranda White is a 18 y.o. G1P0 female at [redacted]w[redacted]d with an Estimated Date of Delivery: 03/27/24 being seen today for ongoing management of a low-risk pregnancy.   Today she reports no complaints. Contractions: Not present. Vag. Bleeding: None.  Movement: Present. denies leaking of fluid.     09/06/2023    3:30 PM 04/20/2022    9:25 AM  Depression screen PHQ 2/9  Decreased Interest 0 0  Down, Depressed, Hopeless 0 1  PHQ - 2 Score 0 1  Altered sleeping 1 0  Tired, decreased energy 1 0  Change in appetite 0 0  Feeling bad or failure about yourself  0 0  Trouble concentrating 0 0  Moving slowly or fidgety/restless 0 0  Suicidal thoughts 0   PHQ-9 Score 2 1        09/06/2023    3:30 PM  GAD 7 : Generalized Anxiety Score  Nervous, Anxious, on Edge 1  Control/stop worrying 0  Worry too much - different things 1  Trouble relaxing 0  Restless 0  Easily annoyed or irritable 1  Afraid - awful might happen 0  Total GAD 7 Score 3      Review of Systems:   Pertinent items are noted in HPI Denies abnormal vaginal discharge w/ itching/odor/irritation, headaches, visual changes, shortness of breath, chest pain, abdominal pain, severe nausea/vomiting, or problems with urination or bowel movements unless otherwise stated above. Pertinent History Reviewed:  Reviewed past medical,surgical, social, obstetrical and family history.  Reviewed problem list, medications and allergies. Physical Assessment:   Vitals:   11/03/23 1506  BP: 99/66  Pulse: 77  Weight: 197 lb (89.4 kg)  Body mass index is 31.8 kg/m.        Physical Examination:   General appearance: Well appearing, and in no distress  Mental status: Alert, oriented to person, place, and time  Skin: Warm & dry  Cardiovascular:  Normal heart rate noted  Respiratory: Normal respiratory effort, no distress  Abdomen: Soft, gravid, nontender  Pelvic: blind CV swab          Extremities: Edema: None  Fetal Status:     Movement: Present  US  19+2 wks,cephalic,posterior placenta gr 0,normal ovaries,CX 3.4 cm,SVP of fluid 4 cm,FHR 146 bpm,two LVEICF,EFW 286 g 46%,anatomy complete   Chaperone: Latisha Cresenzo No results found for this or any previous visit (from the past 24 hours).  Assessment & Plan:  1) Low-risk pregnancy G1P0 at [redacted]w[redacted]d with an Estimated Date of Delivery: 03/27/24   2) Fetal isolated EICF x2, LR NIPS, discussed and gave printed info  3) Recent +CT and trich> s/p tx, POC today   Meds: No orders of the defined types were placed in this encounter.  Labs/procedures today: CV swab, U/S, and declined AFP  Plan:  Continue routine obstetrical care  Next visit: prefers in person    Reviewed: Preterm labor symptoms and general obstetric precautions including but not limited to vaginal bleeding, contractions, leaking of fluid and fetal movement were reviewed in detail with the patient.  All questions were answered. Does have home bp cuff. Office bp cuff given: not applicable. Check bp weekly, let us  know if consistently >140 and/or >90.  Follow-up: Return in about 4 weeks (around 12/01/2023) for LROB, CNM, in person.  Future Appointments  Date  Time Provider Department Center  12/01/2023  2:10 PM Kizzie Suzen SAUNDERS, CNM [facility]    Orders Placed This Encounter  Procedures   AFP, Serum, Open Spina Bifida   Suzen SAUNDERS Kizzie CNM, [facility] 11/03/2023 3:25 PM

## 2023-11-07 ENCOUNTER — Ambulatory Visit: Payer: Self-pay | Admitting: Women's Health

## 2023-11-07 DIAGNOSIS — A749 Chlamydial infection, unspecified: Secondary | ICD-10-CM

## 2023-11-07 DIAGNOSIS — A599 Trichomoniasis, unspecified: Secondary | ICD-10-CM

## 2023-11-07 LAB — CERVICOVAGINAL ANCILLARY ONLY
Bacterial Vaginitis (gardnerella): NEGATIVE
Candida Glabrata: NEGATIVE
Candida Vaginitis: NEGATIVE
Chlamydia: NEGATIVE
Comment: NEGATIVE
Comment: NEGATIVE
Comment: NEGATIVE
Comment: NEGATIVE
Comment: NEGATIVE
Comment: NORMAL
Neisseria Gonorrhea: NEGATIVE
Trichomonas: NEGATIVE

## 2023-11-14 NOTE — Progress Notes (Signed)
 Assessment/Plan:   Assessment & Plan Establishing Care New patient to our clinic. Routine screenings for her age have be completed by her OBGYN care team. Patient needs TB screening for potential employment opportunity.  - Advised routine follow up with prenatal care team - Collect Quantiferon gold test   Return if symptoms worsen or fail to improve.   Subjective:   Chief Complaint: Establish Care    History of Present Illness Miranda White is a 18 year old female who presents for a pre-employment examination and blood test.  She is currently four months pregnant and has been receiving prenatal care with [facility] in [place] since June. Her prenatal care has included blood work, which was last done in July, with all results being normal. She reports that she may have had blood work for HIV or syphilis screening done in June or July by her OBGYN, but is unsure of the details.  She has a past medical history of asthma, which is her only reported medical condition. She has not yet received her flu shot for the current season, although she has received all other necessary vaccines during her pregnancy.  She is currently looking for a job and is not employed.  ROS Negative unless notated in HPI  Objective:  Physical Exam Constitutional:      Appearance: Normal appearance.  HENT:     Head: Normocephalic and atraumatic.  Eyes:     Extraocular Movements: Extraocular movements intact.     Conjunctiva/sclera: Conjunctivae normal.  Cardiovascular:     Rate and Rhythm: Normal rate and regular rhythm.     Heart sounds: Normal heart sounds.  Pulmonary:     Effort: Pulmonary effort is normal.     Breath sounds: Normal breath sounds.  Musculoskeletal:     Right lower leg: No edema.     Left lower leg: No edema.  Neurological:     General: No focal deficit present.     Mental Status: She is alert. Mental status is at baseline.     Visit Vitals BP 102/60  Pulse 91   Temp 36.9 C (98.4 F) (Temporal)  Resp 16  Ht 167.6 cm (5' 6)  Wt 89.3 kg (196 lb 14.4 oz)  LMP 06/28/2023 (Approximate)  SpO2 97%  BMI 31.78 kg/m   BP Readings from Last 3 Encounters:  11/14/23 102/60  10/01/23 109/60  07/31/23 106/63   Wt Readings from Last 3 Encounters:  11/14/23 89.3 kg (196 lb 14.4 oz)  10/01/23 88.3 kg (194 lb 9.6 oz)  07/31/23 89.2 kg (196 lb 9.6 oz)                 This note was created using Dragon/Abdridge dictation software and while every attempt has been made to minimize errors, some may still be  present.

## 2023-12-01 ENCOUNTER — Ambulatory Visit (INDEPENDENT_AMBULATORY_CARE_PROVIDER_SITE_OTHER): Admitting: Women's Health

## 2023-12-01 ENCOUNTER — Encounter: Payer: Self-pay | Admitting: Women's Health

## 2023-12-01 VITALS — BP 118/78 | HR 71 | Wt 201.0 lb

## 2023-12-01 DIAGNOSIS — Z3A23 23 weeks gestation of pregnancy: Secondary | ICD-10-CM | POA: Diagnosis not present

## 2023-12-01 DIAGNOSIS — Z3402 Encounter for supervision of normal first pregnancy, second trimester: Secondary | ICD-10-CM | POA: Diagnosis not present

## 2023-12-01 NOTE — Progress Notes (Signed)
    LOW-RISK PREGNANCY VISIT Patient name: Miranda White MRN [id number]  Date of birth: Feb 09, 2006 Chief Complaint:   Routine Prenatal Visit  History of Present Illness:   Miranda White is a 18 y.o. G1P0 female at [redacted]w[redacted]d with an Estimated Date of Delivery: 03/27/24 being seen today for ongoing management of a low-risk pregnancy.   Today she reports occasional contractions. Contractions: Not present.  .  Movement: Present. denies leaking of fluid.     09/06/2023    3:30 PM 04/20/2022    9:25 AM  Depression screen PHQ 2/9  Decreased Interest 0 0  Down, Depressed, Hopeless 0 1  PHQ - 2 Score 0 1  Altered sleeping 1 0  Tired, decreased energy 1 0  Change in appetite 0 0  Feeling bad or failure about yourself  0 0  Trouble concentrating 0 0  Moving slowly or fidgety/restless 0 0  Suicidal thoughts 0   PHQ-9 Score 2 1        09/06/2023    3:30 PM  GAD 7 : Generalized Anxiety Score  Nervous, Anxious, on Edge 1  Control/stop worrying 0  Worry too much - different things 1  Trouble relaxing 0  Restless 0  Easily annoyed or irritable 1  Afraid - awful might happen 0  Total GAD 7 Score 3      Review of Systems:   Pertinent items are noted in HPI Denies abnormal vaginal discharge w/ itching/odor/irritation, headaches, visual changes, shortness of breath, chest pain, abdominal pain, severe nausea/vomiting, or problems with urination or bowel movements unless otherwise stated above. Pertinent History Reviewed:  Reviewed past medical,surgical, social, obstetrical and family history.  Reviewed problem list, medications and allergies. Physical Assessment:   Vitals:   12/01/23 1426  BP: 118/78  Pulse: 71  Weight: 201 lb (91.2 kg)  Body mass index is 32.44 kg/m.        Physical Examination:   General appearance: Well appearing, and in no distress  Mental status: Alert, oriented to person, place, and time  Skin: Warm & dry  Cardiovascular: Normal heart rate  noted  Respiratory: Normal respiratory effort, no distress  Abdomen: Soft, gravid, nontender  Pelvic: Cervical exam deferred         Extremities: Edema: None  Fetal Status: Fetal Heart Rate (bpm): 147 Fundal Height: 24 cm Movement: Present    Chaperone: N/A No results found for this or any previous visit (from the past 24 hours).  Assessment & Plan:  1) Low-risk pregnancy G1P0 at [redacted]w[redacted]d with an Estimated Date of Delivery: 03/27/24    Meds: No orders of the defined types were placed in this encounter.  Labs/procedures today: none  Plan:  Continue routine obstetrical care  Next visit: prefers will be in person for pn2    Reviewed: Preterm labor symptoms and general obstetric precautions including but not limited to vaginal bleeding, contractions, leaking of fluid and fetal movement were reviewed in detail with the patient.  All questions were answered. Does have home bp cuff. Office bp cuff given: not applicable. Check bp weekly, let us  know if consistently >140 and/or >90.  Follow-up: Return in about 4 weeks (around 12/29/2023) for LROB, PN2, CNM, in person.  Future Appointments  Date Time Provider Department Center  12/29/2023  8:30 AM [facility]    No orders of the defined types were placed in this encounter.  Suzen JONELLE Fetters CNM, [facility] 12/01/2023 2:45 PM

## 2023-12-01 NOTE — Patient Instructions (Signed)
 Miranda White, thank you for choosing our office today! We appreciate the opportunity to meet your healthcare needs. You may receive a short survey by mail, e-mail, or through [employer]. If you are happy with your care we would appreciate if you could take just a few minutes to complete the survey questions. We read all of your comments and take your feedback very seriously. Thank you again for choosing our office.  Center for [employer] Team at [facility]  [facility] ([address] [place], [place] [address]) Entrance C, located off of [address] Free 24/7 valet parking   You will have your sugar test next visit.  Please do not eat or drink anything after midnight the night before you come, not even water.  You will be here for at least two hours.  Please make an appointment online for the bloodwork at [email or web address] for 8:00am (or as close to this as possible). Make sure you select the [facility] service center.   CLASSES: Go to [email or web address] to register for classes (childbirth, breastfeeding, waterbirth, infant CPR, daddy bootcamp, etc.)  Call the office [phone]) or go to [facility] if: You begin to have strong, frequent contractions Your water breaks.  Sometimes it is a big gush of fluid, sometimes it is just a trickle that keeps getting your panties wet or running down your legs You have vaginal bleeding.  It is normal to have a small amount of spotting if your cervix was checked.  You don't feel your baby moving like normal.  If you don't, get you something to eat and drink and lay down and focus on feeling your baby move.   If your baby is still not moving like normal, you should call the office or go to [facility].  Call the office [phone]) or go to [facility] hospital for these signs of pre-eclampsia: Severe headache that does not go away with Tylenol Visual changes- seeing spots, double, blurred vision Pain under your right breast or  upper abdomen that does not go away with Tums or heartburn medicine Nausea and/or vomiting Severe swelling in your hands, feet, and face    [place] Pediatricians/Family Doctors [facility]): [address] Luba BROCKS, [phone]           [facility]: [address], [phone]                [facility]): [address], [phone]  [facility] Department: [address], [place], [phone]    [facility] Pediatricians/Family Doctors [facility]): [address], [phone] [facility]: [address] [place], [phone] [facility]: [address], [phone]  [facility] Doctors  [facility]): [phone] [facility]: [address], [phone]   [facility] Doctors [facility]: [address] [phone]  [facility] Doctors  [facility]: [phone], [phone]  Home Blood Pressure Monitoring for Patients   Your provider has recommended that you check your blood pressure (BP) at least once a week at home. If you do not have a blood pressure cuff at home, one will be provided for you. Contact your provider if you have not received your monitor within 1 week.   Helpful Tips for Accurate Home Blood Pressure Checks  Don't smoke, exercise, or drink caffeine 30 minutes before checking  your BP Use the restroom before checking your BP (a full bladder can raise your pressure) Relax in a comfortable upright chair Feet on the ground Left arm resting comfortably on a flat surface at the level of your heart Legs uncrossed Back supported Sit quietly and don't talk Place the cuff on your bare arm Adjust snuggly, so that only two fingertips can fit between your skin and the top of the cuff Check 2 readings separated by at least one  minute Keep a log of your BP readings For a visual, please reference this diagram: http://ccnc.care/bpdiagram  Provider Name: [facility]     Phone: [phone]  Zone 1: ALL CLEAR  Continue to monitor your symptoms:  BP reading is less than 140 (top number) or less than 90 (bottom number)  No right upper stomach pain No headaches or seeing spots No feeling nauseated or throwing up No swelling in face and hands  Zone 2: CAUTION Call your doctor's office for any of the following:  BP reading is greater than 140 (top number) or greater than 90 (bottom number)  Stomach pain under your ribs in the middle or right side Headaches or seeing spots Feeling nauseated or throwing up Swelling in face and hands  Zone 3: EMERGENCY  Seek immediate medical care if you have any of the following:  BP reading is greater than160 (top number) or greater than 110 (bottom number) Severe headaches not improving with Tylenol Serious difficulty catching your breath Any worsening symptoms from Zone 2   Second Trimester of Pregnancy The second trimester is from week 13 through week 28, months 4 through 6. The second trimester is often a time when you feel your best. Your body has also adjusted to being pregnant, and you begin to feel better physically. Usually, morning sickness has lessened or quit completely, you may have more energy, and you may have an increase in appetite. The second trimester is also a time when the fetus is growing rapidly. At the end of the sixth month, the fetus is about 9 inches long and weighs about 1 pounds. You will likely begin to feel the baby move (quickening) between 18 and 20 weeks of the pregnancy. BODY CHANGES Your body goes through many changes during pregnancy. The changes vary from woman to woman.  Your weight will continue to increase. You will notice your lower abdomen bulging out. You may begin to get stretch marks on your hips, abdomen, and breasts. You may  develop headaches that can be relieved by medicines approved by your health care provider. You may urinate more often because the fetus is pressing on your bladder. You may develop or continue to have heartburn as a result of your pregnancy. You may develop constipation because certain hormones are causing the muscles that push waste through your intestines to slow down. You may develop hemorrhoids or swollen, bulging veins (varicose veins). You may have back pain because of the weight gain and pregnancy hormones relaxing your joints between the bones in your pelvis and as a result of a shift in weight and the muscles that support your balance. Your breasts will continue to grow and be tender. Your gums may bleed and may be sensitive to brushing and flossing. Dark spots or blotches (chloasma, mask of pregnancy) may develop on your face. This will likely fade after the baby is born. A dark line from your belly button to the pubic area (linea nigra) may appear. This will likely fade after the  baby is born. You may have changes in your hair. These can include thickening of your hair, rapid growth, and changes in texture. Some women also have hair loss during or after pregnancy, or hair that feels dry or thin. Your hair will most likely return to normal after your baby is born. WHAT TO EXPECT AT YOUR PRENATAL VISITS During a routine prenatal visit: You will be weighed to make sure you and the fetus are growing normally. Your blood pressure will be taken. Your abdomen will be measured to track your baby's growth. The fetal heartbeat will be listened to. Any test results from the previous visit will be discussed. Your health care provider may ask you: How you are feeling. If you are feeling the baby move. If you have had any abnormal symptoms, such as leaking fluid, bleeding, severe headaches, or abdominal cramping. If you have any questions. Other tests that may be performed during your second  trimester include: Blood tests that check for: Low iron levels (anemia). Gestational diabetes (between 24 and 28 weeks). Rh antibodies. Urine tests to check for infections, diabetes, or protein in the urine. An ultrasound to confirm the proper growth and development of the baby. An amniocentesis to check for possible genetic problems. Fetal screens for spina bifida and Down syndrome. HOME CARE INSTRUCTIONS  Avoid all smoking, herbs, alcohol, and unprescribed drugs. These chemicals affect the formation and growth of the baby. Follow your health care provider's instructions regarding medicine use. There are medicines that are either safe or unsafe to take during pregnancy. Exercise only as directed by your health care provider. Experiencing uterine cramps is a good sign to stop exercising. Continue to eat regular, healthy meals. Wear a good support bra for breast tenderness. Do not use hot tubs, steam rooms, or saunas. Wear your seat belt at all times when driving. Avoid raw meat, uncooked cheese, cat litter boxes, and soil used by cats. These carry germs that can cause birth defects in the baby. Take your prenatal vitamins. Try taking a stool softener (if your health care provider approves) if you develop constipation. Eat more high-fiber foods, such as fresh vegetables or fruit and whole grains. Drink plenty of fluids to keep your urine clear or pale yellow. Take warm sitz baths to soothe any pain or discomfort caused by hemorrhoids. Use hemorrhoid cream if your health care provider approves. If you develop varicose veins, wear support hose. Elevate your feet for 15 minutes, 3-4 times a day. Limit salt in your diet. Avoid heavy lifting, wear low heel shoes, and practice good posture. Rest with your legs elevated if you have leg cramps or low back pain. Visit your dentist if you have not gone yet during your pregnancy. Use a soft toothbrush to brush your teeth and be gentle when you floss. A  sexual relationship may be continued unless your health care provider directs you otherwise. Continue to go to all your prenatal visits as directed by your health care provider. SEEK MEDICAL CARE IF:  You have dizziness. You have mild pelvic cramps, pelvic pressure, or nagging pain in the abdominal area. You have persistent nausea, vomiting, or diarrhea. You have a bad smelling vaginal discharge. You have pain with urination. SEEK IMMEDIATE MEDICAL CARE IF:  You have a fever. You are leaking fluid from your vagina. You have spotting or bleeding from your vagina. You have severe abdominal cramping or pain. You have rapid weight gain or loss. You have shortness of breath with chest pain. You  notice sudden or extreme swelling of your face, hands, ankles, feet, or legs. You have not felt your baby move in over an hour. You have severe headaches that do not go away with medicine. You have vision changes. Document Released: 01/19/2001 Document Revised: 01/30/2013 Document Reviewed: 03/28/2012 [facility] Patient Information 2015 [place], [place]. This information is not intended to replace advice given to you by your health care provider. Make sure you discuss any questions you have with your health care provider.

## 2023-12-29 ENCOUNTER — Other Ambulatory Visit

## 2023-12-29 ENCOUNTER — Ambulatory Visit: Admitting: Obstetrics & Gynecology

## 2023-12-29 ENCOUNTER — Encounter: Payer: Self-pay | Admitting: Obstetrics & Gynecology

## 2023-12-29 VITALS — BP 120/78 | HR 82 | Wt 204.2 lb

## 2023-12-29 DIAGNOSIS — Z23 Encounter for immunization: Secondary | ICD-10-CM

## 2023-12-29 DIAGNOSIS — Z131 Encounter for screening for diabetes mellitus: Secondary | ICD-10-CM

## 2023-12-29 DIAGNOSIS — Z3A27 27 weeks gestation of pregnancy: Secondary | ICD-10-CM

## 2023-12-29 DIAGNOSIS — Z3402 Encounter for supervision of normal first pregnancy, second trimester: Secondary | ICD-10-CM

## 2023-12-29 NOTE — Progress Notes (Signed)
   LOW-RISK PREGNANCY VISIT Patient name: Miranda White MRN [id number]  Date of birth: 04-May-2005 Chief Complaint:   Routine Prenatal Visit  History of Present Illness:   Miranda White is a 18 y.o. G1P0 female at [redacted]w[redacted]d with an Estimated Date of Delivery: 03/27/24 being seen today for ongoing management of a low-risk pregnancy.       12/29/2023   11:00 AM 09/06/2023    3:30 PM 04/20/2022    9:25 AM  Depression screen PHQ 2/9  Decreased Interest 2 0 0  Down, Depressed, Hopeless 0 0 1  PHQ - 2 Score 2 0 1  Altered sleeping 0 1 0  Tired, decreased energy 2 1 0  Change in appetite 0 0 0  Feeling bad or failure about yourself  0 0 0  Trouble concentrating 0 0 0  Moving slowly or fidgety/restless 0 0 0  Suicidal thoughts 0 0   PHQ-9 Score 4 2  1       Data saved with a previous flowsheet row definition    Today she reports no complaints. Contractions: Not present. Vag. Bleeding: None.  Movement: Present. denies leaking of fluid. Review of Systems:   Pertinent items are noted in HPI Denies abnormal vaginal discharge w/ itching/odor/irritation, headaches, visual changes, shortness of breath, chest pain, abdominal pain, severe nausea/vomiting, or problems with urination or bowel movements unless otherwise stated above. Pertinent History Reviewed:  Reviewed past medical,surgical, social, obstetrical and family history.  Reviewed problem list, medications and allergies.  Physical Assessment:   Vitals:   12/29/23 1041  BP: 120/78  Pulse: 82  Weight: 204 lb 3.2 oz (92.6 kg)  Body mass index is 32.96 kg/m.        Physical Examination:   General appearance: Well appearing, and in no distress  Mental status: Alert, oriented to person, place, and time  Skin: Warm & dry  Respiratory: Normal respiratory effort, no distress  Abdomen: Soft, gravid, nontender  Pelvic: Cervical exam deferred         Extremities:  no edema  Psych:  mood and affect appropriate  Fetal  Status:     Movement: Present    Chaperone: n/a    No results found for this or any previous visit (from the past 24 hours).   Assessment & Plan:  1) Low-risk pregnancy G1P0 at [redacted]w[redacted]d with an Estimated Date of Delivery: 03/27/24   2) HSV []  plan for prophylaxis around 36 weeks  Meds: No orders of the defined types were placed in this encounter.  Labs/procedures today: PN2 today, also flu shot given  Plan:  Continue routine obstetrical care  Next visit: prefers in person    Reviewed: Preterm labor symptoms and general obstetric precautions including but not limited to vaginal bleeding, contractions, leaking of fluid and fetal movement were reviewed in detail with the patient.  All questions were answered.   Follow-up: Return in about 3 weeks (around 01/19/2024) for LROB visit.  No orders of the defined types were placed in this encounter.   Desta Bujak, DO Attending Obstetrician & Gynecologist, [facility] for [employer], [facility]

## 2023-12-30 ENCOUNTER — Ambulatory Visit: Payer: Self-pay | Admitting: Obstetrics & Gynecology

## 2023-12-30 LAB — CBC
Hematocrit: 34.1 % (ref 34.0–46.6)
Hemoglobin: 11.3 g/dL (ref 11.1–15.9)
MCH: 31.1 pg (ref 26.6–33.0)
MCHC: 33.1 g/dL (ref 31.5–35.7)
MCV: 94 fL (ref 79–97)
Platelets: 394 x10E3/uL (ref 150–450)
RBC: 3.63 x10E6/uL — ABNORMAL LOW (ref 3.77–5.28)
RDW: 11.8 % (ref 11.7–15.4)
WBC: 8.1 x10E3/uL (ref 3.4–10.8)

## 2023-12-30 LAB — ANTIBODY SCREEN: Antibody Screen: NEGATIVE

## 2023-12-30 LAB — GLUCOSE TOLERANCE, 2 HOURS W/ 1HR
Glucose, 1 hour: 85 mg/dL (ref 70–179)
Glucose, 2 hour: 73 mg/dL (ref 70–152)
Glucose, Fasting: 72 mg/dL (ref 70–91)

## 2023-12-30 LAB — HIV ANTIBODY (ROUTINE TESTING W REFLEX): HIV Screen 4th Generation wRfx: NONREACTIVE

## 2023-12-30 LAB — SYPHILIS: RPR W/REFLEX TO RPR TITER AND TREPONEMAL ANTIBODIES, TRADITIONAL SCREENING AND DIAGNOSIS ALGORITHM: RPR Ser Ql: NONREACTIVE

## 2024-01-11 ENCOUNTER — Encounter: Payer: Self-pay | Admitting: Obstetrics & Gynecology

## 2024-01-18 ENCOUNTER — Encounter: Payer: Self-pay | Admitting: Obstetrics & Gynecology

## 2024-01-18 ENCOUNTER — Ambulatory Visit: Admitting: Obstetrics & Gynecology

## 2024-01-18 VITALS — BP 114/77 | HR 85 | Wt 209.4 lb

## 2024-01-18 DIAGNOSIS — Z3403 Encounter for supervision of normal first pregnancy, third trimester: Secondary | ICD-10-CM

## 2024-01-18 DIAGNOSIS — Z3A30 30 weeks gestation of pregnancy: Secondary | ICD-10-CM | POA: Diagnosis not present

## 2024-01-18 DIAGNOSIS — Z8619 Personal history of other infectious and parasitic diseases: Secondary | ICD-10-CM | POA: Diagnosis not present

## 2024-01-18 NOTE — Progress Notes (Signed)
 LOW-RISK PREGNANCY VISIT Patient name: Miranda White MRN [id number]  Date of birth: 2005/11/13 Chief Complaint:   Routine Prenatal Visit  History of Present Illness:   Miranda White is a 18 y.o. G1P0 female at [redacted]w[redacted]d with an Estimated Date of Delivery: 03/27/24 being seen today for ongoing management of a low-risk pregnancy.      12/29/2023   11:00 AM 09/06/2023    3:30 PM 04/20/2022    9:25 AM  Depression screen PHQ 2/9  Decreased Interest 2 0 0  Down, Depressed, Hopeless 0 0 1  PHQ - 2 Score 2 0 1  Altered sleeping 0 1 0  Tired, decreased energy 2 1 0  Change in appetite 0 0 0  Feeling bad or failure about yourself  0 0 0  Trouble concentrating 0 0 0  Moving slowly or fidgety/restless 0 0 0  Suicidal thoughts 0 0   PHQ-9 Score 4 2  1       Data saved with a previous flowsheet row definition    Notes episodes where she feels like her blood pressure might be low.  She does not typically check it but just notes feeling hot and flushed as though she might pass out.  Reviewed conservative options to help avoid the symptoms.  Patient to keep us  posted if these events continue   Contractions: Not present. Vag. Bleeding: None.  Movement: Present. denies leaking of fluid. Review of Systems:   Pertinent items are noted in HPI Denies abnormal vaginal discharge w/ itching/odor/irritation, headaches, visual changes, shortness of breath, chest pain, abdominal pain, severe nausea/vomiting, or problems with urination or bowel movements unless otherwise stated above. Pertinent History Reviewed:  Reviewed past medical,surgical, social, obstetrical and family history.  Reviewed problem list, medications and allergies.  Physical Assessment:   Vitals:   01/18/24 1531  BP: 114/77  Pulse: 85  Weight: 209 lb 6.4 oz (95 kg)  Body mass index is 33.8 kg/m.        Physical Examination:   General appearance: Well appearing, and in no distress  Mental status: Alert, oriented to  person, place, and time  Skin: Warm & dry  Respiratory: Normal respiratory effort, no distress  Abdomen: Soft, gravid, nontender  Pelvic: Cervical exam deferred         Extremities: Edema: None  Psych:  mood and affect appropriate  Fetal Status: Fetal Heart Rate (bpm): 150 Fundal Height: 30 cm Movement: Present    Chaperone: n/a    No results found for this or any previous visit (from the past 24 hours).   Assessment & Plan:  1) Low-risk pregnancy G1P0 at [redacted]w[redacted]d with an Estimated Date of Delivery: 03/27/24   -Tdap next visit -HSV - asymptomatic []  plan for treatment ~ 36wks   Meds: No orders of the defined types were placed in this encounter.  Labs/procedures today: none  Plan:  Continue routine obstetrical care  Next visit: prefers in person    Reviewed: Preterm labor symptoms and general obstetric precautions including but not limited to vaginal bleeding, contractions, leaking of fluid and fetal movement were reviewed in detail with the patient.  All questions were answered. Pt has home bp cuff. Check bp weekly, let us  know if >140/90.   Follow-up: Return in about 2 weeks (around 02/01/2024) for LROB visit.  No orders of the defined types were placed in this encounter.   Jayln Madeira, DO Attending Obstetrician & Gynecologist, [facility] for [employer], [facility]

## 2024-01-19 ENCOUNTER — Encounter: Admitting: Women's Health

## 2024-01-31 ENCOUNTER — Encounter: Payer: Self-pay | Admitting: Women's Health

## 2024-01-31 ENCOUNTER — Ambulatory Visit (INDEPENDENT_AMBULATORY_CARE_PROVIDER_SITE_OTHER): Admitting: Women's Health

## 2024-01-31 VITALS — BP 107/74 | HR 80 | Wt 209.4 lb

## 2024-01-31 DIAGNOSIS — Z3403 Encounter for supervision of normal first pregnancy, third trimester: Secondary | ICD-10-CM | POA: Diagnosis not present

## 2024-01-31 DIAGNOSIS — Z3A32 32 weeks gestation of pregnancy: Secondary | ICD-10-CM | POA: Diagnosis not present

## 2024-01-31 DIAGNOSIS — Z23 Encounter for immunization: Secondary | ICD-10-CM

## 2024-01-31 NOTE — Progress Notes (Signed)
 "   LOW-RISK PREGNANCY VISIT Patient name: Miranda White MRN [id number]  Date of birth: 2005/05/05 Chief Complaint:   Routine Prenatal Visit  History of Present Illness:   Miranda White is a 18 y.o. G1P0 female at [redacted]w[redacted]d with an Estimated Date of Delivery: 03/27/24 being seen today for ongoing management of a low-risk pregnancy.   Today she reports no complaints. Contractions: Not present. Vag. Bleeding: None.  Movement: Present. denies leaking of fluid.     12/29/2023   11:00 AM 09/06/2023    3:30 PM 04/20/2022    9:25 AM  Depression screen PHQ 2/9  Decreased Interest 2 0 0  Down, Depressed, Hopeless 0 0 1  PHQ - 2 Score 2 0 1  Altered sleeping 0 1 0  Tired, decreased energy 2 1 0  Change in appetite 0 0 0  Feeling bad or failure about yourself  0 0 0  Trouble concentrating 0 0 0  Moving slowly or fidgety/restless 0 0 0  Suicidal thoughts 0 0   PHQ-9 Score 4 2  1       Data saved with a previous flowsheet row definition        12/29/2023   11:00 AM 09/06/2023    3:30 PM  GAD 7 : Generalized Anxiety Score  Nervous, Anxious, on Edge 0 1  Control/stop worrying 1 0  Worry too much - different things 1 1  Trouble relaxing 0 0  Restless 0 0  Easily annoyed or irritable 1 1  Afraid - awful might happen 0 0  Total GAD 7 Score 3 3      Review of Systems:   Pertinent items are noted in HPI Denies abnormal vaginal discharge w/ itching/odor/irritation, headaches, visual changes, shortness of breath, chest pain, abdominal pain, severe nausea/vomiting, or problems with urination or bowel movements unless otherwise stated above. Pertinent History Reviewed:  Reviewed past medical,surgical, social, obstetrical and family history.  Reviewed problem list, medications and allergies. Physical Assessment:   Vitals:   01/31/24 0850  BP: 107/74  Pulse: 80  Weight: 209 lb 6.4 oz (95 kg)  Body mass index is 33.8 kg/m.        Physical Examination:   General  appearance: Well appearing, and in no distress  Mental status: Alert, oriented to person, place, and time  Skin: Warm & dry  Cardiovascular: Normal heart rate noted  Respiratory: Normal respiratory effort, no distress  Abdomen: Soft, gravid, nontender  Pelvic: Cervical exam deferred         Extremities: Edema: None  Fetal Status: Fetal Heart Rate (bpm): 141 Fundal Height: 32 cm Movement: Present    Chaperone: N/A No results found for this or any previous visit (from the past 24 hours).  Assessment & Plan:  1) Low-risk pregnancy G1P0 at [redacted]w[redacted]d with an Estimated Date of Delivery: 03/27/24    Meds: No orders of the defined types were placed in this encounter.  Labs/procedures today: Tdap and declines RSV vaccine today, maybe next visit  Plan:  Continue routine obstetrical care  Next visit: prefers in person    Reviewed: Preterm labor symptoms and general obstetric precautions including but not limited to vaginal bleeding, contractions, leaking of fluid and fetal movement were reviewed in detail with the patient.  All questions were answered. Does have home bp cuff. Office bp cuff given: not applicable. Check bp weekly, let us  know if consistently >140 and/or >90.  Follow-up: Return in about 2 weeks (around 02/14/2024) for LROB, CNM,  in person x2, then weekly.  No future appointments.  Orders Placed This Encounter  Procedures   Tdap vaccine greater than or equal to 7yo IM   Suzen JONELLE Fetters CNM, [facility] 01/31/2024 9:03 AM  "

## 2024-01-31 NOTE — Patient Instructions (Signed)
 Miranda White, thank you for choosing our office today! We appreciate the opportunity to meet your healthcare needs. You may receive a short survey by mail, e-mail, or through [employer]. If you are happy with your care we would appreciate if you could take just a few minutes to complete the survey questions. We read all of your comments and take your feedback very seriously. Thank you again for choosing our office.  Center for [employer] Team at [facility]  [facility] ([address] [place], [place] [address]) Entrance C, located off of [address] Free 24/7 valet parking   CLASSES: Go to [email or web address] to register for classes (childbirth, breastfeeding, waterbirth, infant CPR, daddy bootcamp, etc.)  Call the office [phone]) or go to [facility] if: You begin to have strong, frequent contractions Your water breaks.  Sometimes it is a big gush of fluid, sometimes it is just a trickle that keeps getting your panties wet or running down your legs You have vaginal bleeding.  It is normal to have a small amount of spotting if your cervix was checked.  You don't feel your baby moving like normal.  If you don't, get you something to eat and drink and lay down and focus on feeling your baby move.   If your baby is still not moving like normal, you should call the office or go to [facility].  Call the office [phone]) or go to [facility] hospital for these signs of pre-eclampsia: Severe headache that does not go away with Tylenol Visual changes- seeing spots, double, blurred vision Pain under your right breast or upper abdomen that does not go away with Tums or heartburn medicine Nausea and/or vomiting Severe swelling in your hands, feet, and face   Tdap Vaccine It is recommended that you get the Tdap vaccine during the third trimester of EACH pregnancy to help protect your baby from getting pertussis (whooping cough) 27-36 weeks is the BEST time to do  this so that you can pass the protection on to your baby. During pregnancy is better than after pregnancy, but if you are unable to get it during pregnancy it will be offered at the hospital.  You can get this vaccine with us , at the health department, your family doctor, or some local pharmacies Everyone who will be around your baby should also be up-to-date on their vaccines before the baby comes. Adults (who are not pregnant) only need 1 dose of Tdap during adulthood.   [facility] Pediatricians/Family Doctors [facility]): [address] Luba BROCKS, [phone]           [facility]: [address], [phone]                [facility] Medicine [facility]): [address], [phone] (call to ask if accepting patients) [facility] Department: [address], [place], [phone]    [facility] Pediatricians/Family Doctors [facility]): [phone] [address], [phone] [facility]: [address] [place], [phone] [facility]: [address], [phone]  [facility] Doctors  [facility]): [phone] [facility]: [address] [phone]   [facility] Doctors [facility]: [address] [phone]  [facility] Family Doctors  [facility]: [phone], [phone]  Home Blood Pressure Monitoring for Patients   Your provider has recommended that you check your  blood pressure (BP) at least once a week at home. If you do not have a blood pressure cuff at home, one will be provided for you. Contact your provider if you have not received your monitor within 1 week.   Helpful Tips for Accurate Home Blood Pressure Checks  Don't smoke, exercise, or drink caffeine 30 minutes before checking your BP Use the restroom before checking your BP (a full bladder can raise your  pressure) Relax in a comfortable upright chair Feet on the ground Left arm resting comfortably on a flat surface at the level of your heart Legs uncrossed Back supported Sit quietly and don't talk Place the cuff on your bare arm Adjust snuggly, so that only two fingertips can fit between your skin and the top of the cuff Check 2 readings separated by at least one minute Keep a log of your BP readings For a visual, please reference this diagram: http://ccnc.care/bpdiagram  Provider Name: [facility]     Phone: [phone]  Zone 1: ALL CLEAR  Continue to monitor your symptoms:  BP reading is less than 140 (top number) or less than 90 (bottom number)  No right upper stomach pain No headaches or seeing spots No feeling nauseated or throwing up No swelling in face and hands  Zone 2: CAUTION Call your doctor's office for any of the following:  BP reading is greater than 140 (top number) or greater than 90 (bottom number)  Stomach pain under your ribs in the middle or right side Headaches or seeing spots Feeling nauseated or throwing up Swelling in face and hands  Zone 3: EMERGENCY  Seek immediate medical care if you have any of the following:  BP reading is greater than160 (top number) or greater than 110 (bottom number) Severe headaches not improving with Tylenol Serious difficulty catching your breath Any worsening symptoms from Zone 2  Preterm Labor and Birth Information  The normal length of a pregnancy is 39-41 weeks. Preterm labor is when labor starts before 37 completed weeks of pregnancy. What are the risk factors for preterm labor? Preterm labor is more likely to occur in women who: Have certain infections during pregnancy such as a bladder infection, sexually transmitted infection, or infection inside the uterus (chorioamnionitis). Have a shorter-than-normal cervix. Have gone into preterm labor before. Have had surgery on their cervix. Are younger than age 10  or older than age 39. Are African American. Are pregnant with twins or multiple babies (multiple gestation). Take street drugs or smoke while pregnant. Do not gain enough weight while pregnant. Became pregnant shortly after having been pregnant. What are the symptoms of preterm labor? Symptoms of preterm labor include: Cramps similar to those that can happen during a menstrual period. The cramps may happen with diarrhea. Pain in the abdomen or lower back. Regular uterine contractions that may feel like tightening of the abdomen. A feeling of increased pressure in the pelvis. Increased watery or bloody mucus discharge from the vagina. Water breaking (ruptured amniotic sac). Why is it important to recognize signs of preterm labor? It is important to recognize signs of preterm labor because babies who are born prematurely may not be fully developed. This can put them at an increased risk for: Long-term (chronic) heart and lung problems. Difficulty immediately after birth with regulating body systems, including blood sugar, body temperature, heart rate, and breathing rate. Bleeding in the brain. Cerebral palsy. Learning difficulties. Death. These risks are highest for babies who are born before 34 weeks  of pregnancy. How is preterm labor treated? Treatment depends on the length of your pregnancy, your condition, and the health of your baby. It may involve: Having a stitch (suture) placed in your cervix to prevent your cervix from opening too early (cerclage). Taking or being given medicines, such as: Hormone medicines. These may be given early in pregnancy to help support the pregnancy. Medicine to stop contractions. Medicines to help mature the babys lungs. These may be prescribed if the risk of delivery is high. Medicines to prevent your baby from developing cerebral palsy. If the labor happens before 34 weeks of pregnancy, you may need to stay in the hospital. What should I do if I  think I am in preterm labor? If you think that you are going into preterm labor, call your health care provider right away. How can I prevent preterm labor in future pregnancies? To increase your chance of having a full-term pregnancy: Do not use any tobacco products, such as cigarettes, chewing tobacco, and e-cigarettes. If you need help quitting, ask your health care provider. Do not use street drugs or medicines that have not been prescribed to you during your pregnancy. Talk with your health care provider before taking any herbal supplements, even if you have been taking them regularly. Make sure you gain a healthy amount of weight during your pregnancy. Watch for infection. If you think that you might have an infection, get it checked right away. Make sure to tell your health care provider if you have gone into preterm labor before. This information is not intended to replace advice given to you by your health care provider. Make sure you discuss any questions you have with your health care provider. Document Revised: 05/19/2018 Document Reviewed: 06/18/2015 Elsevier Patient Education  2020 [employer].

## 2024-02-15 ENCOUNTER — Ambulatory Visit: Admitting: Advanced Practice Midwife

## 2024-02-15 ENCOUNTER — Encounter: Payer: Self-pay | Admitting: Advanced Practice Midwife

## 2024-02-15 VITALS — Wt 210.0 lb

## 2024-02-15 DIAGNOSIS — O98513 Other viral diseases complicating pregnancy, third trimester: Secondary | ICD-10-CM

## 2024-02-15 DIAGNOSIS — B009 Herpesviral infection, unspecified: Secondary | ICD-10-CM | POA: Diagnosis not present

## 2024-02-15 DIAGNOSIS — Z3A34 34 weeks gestation of pregnancy: Secondary | ICD-10-CM

## 2024-02-15 DIAGNOSIS — Z3403 Encounter for supervision of normal first pregnancy, third trimester: Secondary | ICD-10-CM

## 2024-02-15 MED ORDER — ACYCLOVIR 400 MG PO TABS: 400.0000 mg | ORAL_TABLET | Freq: Three times a day (TID) | ORAL | 3 refills | Status: AC

## 2024-02-15 NOTE — Progress Notes (Signed)
" ° °  LOW-RISK PREGNANCY VISIT Patient name: Miranda White MRN [id number]  Date of birth: 06-27-2005 Chief Complaint:   Routine Prenatal Visit  History of Present Illness:   Miranda White is a 19 y.o. G1P0 female at [redacted]w[redacted]d with an Estimated Date of Delivery: 03/27/24 being seen today for ongoing management of a low-risk pregnancy.  Today she reports no complaints. Contractions: Not present.  .  Movement: Present. denies leaking of fluid. Review of Systems:   Pertinent items are noted in HPI Denies abnormal vaginal discharge w/ itching/odor/irritation, headaches, visual changes, shortness of breath, chest pain, abdominal pain, severe nausea/vomiting, or problems with urination or bowel movements unless otherwise stated above. Pertinent History Reviewed:  Reviewed past medical,surgical, social, obstetrical and family history.  Reviewed problem list, medications and allergies. Physical Assessment:   Vitals:   02/15/24 1617  Weight: 210 lb (95.3 kg)  Body mass index is 33.89 kg/m.        Physical Examination:   General appearance: Well appearing, and in no distress  Mental status: Alert, oriented to person, place, and time  Skin: Warm & dry  Cardiovascular: Normal heart rate noted  Respiratory: Normal respiratory effort, no distress  Abdomen: Soft, gravid, nontender  Pelvic: Cervical exam deferred         Extremities: Edema: None  Fetal Status: Fetal Heart Rate (bpm): 142 Fundal Height: 34 cm Movement: Present    No results found for this or any previous visit (from the past 24 hours).  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    Dispense:  90 tablet    Refill:  3     Supervising Provider:   MARILYNN NEST [[id number]]   Labs/procedures today: none  Plan:  Continue routine obstetrical care   Reviewed: Preterm labor symptoms and general obstetric precautions including but not limited to vaginal bleeding, contractions, leaking of fluid and fetal movement were reviewed in detail with the patient.  All questions were answered. Has home bp cuff. Check bp weekly, let us  know if >140/90.   Follow-up: Return for As scheduled.(GBS/cultures @ [facility])  No orders of the defined types were placed in this encounter.  Suzen JONETTA Gentry CNM 02/15/2024 4:45 PM  "

## 2024-02-15 NOTE — Patient Instructions (Signed)
 Miranda White, thank you for choosing our office today! We appreciate the opportunity to meet your healthcare needs. You may receive a short survey by mail, e-mail, or through [employer]. If you are happy with your care we would appreciate if you could take just a few minutes to complete the survey questions. We read all of your comments and take your feedback very seriously. Thank you again for choosing our office.  Center for [employer] Team at [facility]  [facility] ([address] [place], [place] [address]) Entrance C, located off of [address] Free 24/7 valet parking   CLASSES: Go to [email or web address] to register for classes (childbirth, breastfeeding, waterbirth, infant CPR, daddy bootcamp, etc.)  Call the office [phone]) or go to [facility] if: You begin to have strong, frequent contractions Your water breaks.  Sometimes it is a big gush of fluid, sometimes it is just a trickle that keeps getting your panties wet or running down your legs You have vaginal bleeding.  It is normal to have a small amount of spotting if your cervix was checked.  You don't feel your baby moving like normal.  If you don't, get you something to eat and drink and lay down and focus on feeling your baby move.   If your baby is still not moving like normal, you should call the office or go to [facility]  Call the office [phone]) or go to [facility] hospital for these signs of pre-eclampsia: Severe headache that does not go away with Tylenol Visual changes- seeing spots, double, blurred vision Pain under your right breast or upper abdomen that does not go away with Tums or heartburn medicine Nausea and/or vomiting Severe swelling in your hands, feet, and face   Tdap Vaccine It is recommended that you get the Tdap vaccine during the third trimester of EACH pregnancy to help protect your baby from getting pertussis (whooping cough) 27-36 weeks is the BEST time to do  this so that you can pass the protection on to your baby. During pregnancy is better than after pregnancy, but if you are unable to get it during pregnancy it will be offered at the hospital.  You can get this vaccine with us , at the health department, your family doctor, or some local pharmacies Everyone who will be around your baby should also be up-to-date on their vaccines before the baby comes. Adults (who are not pregnant) only need 1 dose of Tdap during adulthood.   [facility] Pediatricians/Family Doctors [facility]): [address] [place], [phone]           [facility]: [address], [phone]                [facility]): [address], [phone] (call to ask if accepting patients) [facility] Department: [address], [place], [phone]    [facility] Pediatricians/Family Doctors [facility]): [phone] [address], [phone] [facility]: [address] [place], [phone] [facility]: [address], [phone]  [facility] Doctors  [facility]): [phone] [facility]: [address], [phone]   [facility] Doctors [facility]: [address] [phone]  [facility] Family Doctors  [facility]: [phone], [phone]  Home Blood Pressure Monitoring for Patients   Your provider has recommended that you check your  blood pressure (BP) at least once a week at home. If you do not have a blood pressure cuff at home, one will be provided for you. Contact your provider if you have not received your monitor within 1 week.   Helpful Tips for Accurate Home Blood Pressure Checks  Don't smoke, exercise, or drink caffeine 30 minutes before checking your BP Use the restroom before checking your BP (a full bladder can raise your  pressure) Relax in a comfortable upright chair Feet on the ground Left arm resting comfortably on a flat surface at the level of your heart Legs uncrossed Back supported Sit quietly and don't talk Place the cuff on your bare arm Adjust snuggly, so that only two fingertips can fit between your skin and the top of the cuff Check 2 readings separated by at least one minute Keep a log of your BP readings For a visual, please reference this diagram: http://ccnc.care/bpdiagram  Provider Name: [facility]     Phone: [phone]  Zone 1: ALL CLEAR  Continue to monitor your symptoms:  BP reading is less than 140 (top number) or less than 90 (bottom number)  No right upper stomach pain No headaches or seeing spots No feeling nauseated or throwing up No swelling in face and hands  Zone 2: CAUTION Call your doctor's office for any of the following:  BP reading is greater than 140 (top number) or greater than 90 (bottom number)  Stomach pain under your ribs in the middle or right side Headaches or seeing spots Feeling nauseated or throwing up Swelling in face and hands  Zone 3: EMERGENCY  Seek immediate medical care if you have any of the following:  BP reading is greater than160 (top number) or greater than 110 (bottom number) Severe headaches not improving with Tylenol Serious difficulty catching your breath Any worsening symptoms from Zone 2   Third Trimester of Pregnancy The third trimester is from week 29 through week 42, months 7 through 9. The third trimester is a time when the fetus is growing rapidly. At the end of the ninth month, the fetus is about 20 inches in length and weighs 6-10 pounds.  BODY CHANGES Your body goes through many changes during pregnancy. The changes vary from woman to woman.  Your weight will continue to increase. You can expect to gain 25-35 pounds (11-16 kg) by the end of the pregnancy. You may begin to get stretch marks on your hips, abdomen,  and breasts. You may urinate more often because the fetus is moving lower into your pelvis and pressing on your bladder. You may develop or continue to have heartburn as a result of your pregnancy. You may develop constipation because certain hormones are causing the muscles that push waste through your intestines to slow down. You may develop hemorrhoids or swollen, bulging veins (varicose veins). You may have pelvic pain because of the weight gain and pregnancy hormones relaxing your joints between the bones in your pelvis. Backaches may result from overexertion of the muscles supporting your posture. You may have changes in your hair. These can include thickening of your hair, rapid growth, and changes in texture. Some women also have hair loss during or after pregnancy, or hair that feels dry or thin. Your hair will most likely return to normal after your baby is born. Your breasts will continue to grow and be tender. A yellow discharge may leak from your breasts called colostrum. Your belly button may stick out. You may  feel short of breath because of your expanding uterus. You may notice the fetus dropping, or moving lower in your abdomen. You may have a bloody mucus discharge. This usually occurs a few days to a week before labor begins. Your cervix becomes thin and soft (effaced) near your due date. WHAT TO EXPECT AT YOUR PRENATAL EXAMS  You will have prenatal exams every 2 weeks until week 36. Then, you will have weekly prenatal exams. During a routine prenatal visit: You will be weighed to make sure you and the fetus are growing normally. Your blood pressure is taken. Your abdomen will be measured to track your baby's growth. The fetal heartbeat will be listened to. Any test results from the previous visit will be discussed. You may have a cervical check near your due date to see if you have effaced. At around 36 weeks, your caregiver will check your cervix. At the same time, your  caregiver will also perform a test on the secretions of the vaginal tissue. This test is to determine if a type of bacteria, Group B streptococcus, is present. Your caregiver will explain this further. Your caregiver may ask you: What your birth plan is. How you are feeling. If you are feeling the baby move. If you have had any abnormal symptoms, such as leaking fluid, bleeding, severe headaches, or abdominal cramping. If you have any questions. Other tests or screenings that may be performed during your third trimester include: Blood tests that check for low iron levels (anemia). Fetal testing to check the health, activity level, and growth of the fetus. Testing is done if you have certain medical conditions or if there are problems during the pregnancy. FALSE LABOR You may feel small, irregular contractions that eventually go away. These are called Braxton Hicks contractions, or false labor. Contractions may last for hours, days, or even weeks before true labor sets in. If contractions come at regular intervals, intensify, or become painful, it is best to be seen by your caregiver.  SIGNS OF LABOR  Menstrual-like cramps. Contractions that are 5 minutes apart or less. Contractions that start on the top of the uterus and spread down to the lower abdomen and back. A sense of increased pelvic pressure or back pain. A watery or bloody mucus discharge that comes from the vagina. If you have any of these signs before the 37th week of pregnancy, call your caregiver right away. You need to go to the hospital to get checked immediately. HOME CARE INSTRUCTIONS  Avoid all smoking, herbs, alcohol, and unprescribed drugs. These chemicals affect the formation and growth of the baby. Follow your caregiver's instructions regarding medicine use. There are medicines that are either safe or unsafe to take during pregnancy. Exercise only as directed by your caregiver. Experiencing uterine cramps is a good sign to  stop exercising. Continue to eat regular, healthy meals. Wear a good support bra for breast tenderness. Do not use hot tubs, steam rooms, or saunas. Wear your seat belt at all times when driving. Avoid raw meat, uncooked cheese, cat litter boxes, and soil used by cats. These carry germs that can cause birth defects in the baby. Take your prenatal vitamins. Try taking a stool softener (if your caregiver approves) if you develop constipation. Eat more high-fiber foods, such as fresh vegetables or fruit and whole grains. Drink plenty of fluids to keep your urine clear or pale yellow. Take warm sitz baths to soothe any pain or discomfort caused by hemorrhoids. Use hemorrhoid cream if  your caregiver approves. If you develop varicose veins, wear support hose. Elevate your feet for 15 minutes, 3-4 times a day. Limit salt in your diet. Avoid heavy lifting, wear low heal shoes, and practice good posture. Rest a lot with your legs elevated if you have leg cramps or low back pain. Visit your dentist if you have not gone during your pregnancy. Use a soft toothbrush to brush your teeth and be gentle when you floss. A sexual relationship may be continued unless your caregiver directs you otherwise. Do not travel far distances unless it is absolutely necessary and only with the approval of your caregiver. Take prenatal classes to understand, practice, and ask questions about the labor and delivery. Make a trial run to the hospital. Pack your hospital bag. Prepare the baby's nursery. Continue to go to all your prenatal visits as directed by your caregiver. SEEK MEDICAL CARE IF: You are unsure if you are in labor or if your water has broken. You have dizziness. You have mild pelvic cramps, pelvic pressure, or nagging pain in your abdominal area. You have persistent nausea, vomiting, or diarrhea. You have a bad smelling vaginal discharge. You have pain with urination. SEEK IMMEDIATE MEDICAL CARE IF:  You  have a fever. You are leaking fluid from your vagina. You have spotting or bleeding from your vagina. You have severe abdominal cramping or pain. You have rapid weight loss or gain. You have shortness of breath with chest pain. You notice sudden or extreme swelling of your face, hands, ankles, feet, or legs. You have not felt your baby move in over an hour. You have severe headaches that do not go away with medicine. You have vision changes. Document Released: 01/19/2001 Document Revised: 01/30/2013 Document Reviewed: 03/28/2012 [facility] Patient Information 2015 [place], [place]. This information is not intended to replace advice given to you by your health care provider. Make sure you discuss any questions you have with your health care provider.

## 2024-02-28 ENCOUNTER — Other Ambulatory Visit (HOSPITAL_COMMUNITY)
Admission: RE | Admit: 2024-02-28 | Discharge: 2024-02-28 | Disposition: A | Discharge status: Home / Self Care | Source: Ambulatory Visit | Attending: Women's Health | Admitting: Women's Health

## 2024-02-28 ENCOUNTER — Encounter: Payer: Self-pay | Admitting: Women's Health

## 2024-02-28 ENCOUNTER — Ambulatory Visit: Admitting: Women's Health

## 2024-02-28 VITALS — BP 104/70 | HR 97 | Wt 212.0 lb

## 2024-02-28 DIAGNOSIS — Z3483 Encounter for supervision of other normal pregnancy, third trimester: Secondary | ICD-10-CM | POA: Diagnosis present

## 2024-02-28 DIAGNOSIS — Z3A36 36 weeks gestation of pregnancy: Secondary | ICD-10-CM

## 2024-02-28 DIAGNOSIS — Z3403 Encounter for supervision of normal first pregnancy, third trimester: Secondary | ICD-10-CM

## 2024-02-28 NOTE — Progress Notes (Signed)
 "   LOW-RISK PREGNANCY VISIT Patient name: Miranda White MRN [id number]  Date of birth: Apr 11, 2005 Chief Complaint:   Routine Prenatal Visit (culture)  History of Present Illness:   Miranda White is a 19 y.o. G1P0 female at [redacted]w[redacted]d with an Estimated Date of Delivery: 03/27/24 being seen today for ongoing management of a low-risk pregnancy.   Today she reports no complaints. Thinks she's getting a cold, sore throat and runny nose. No fever/chills. Contractions: Irregular.  .  Movement: Present. denies leaking of fluid.     12/29/2023   11:00 AM 09/06/2023    3:30 PM 04/20/2022    9:25 AM  Depression screen PHQ 2/9  Decreased Interest 2 0 0  Down, Depressed, Hopeless 0 0 1  PHQ - 2 Score 2 0 1  Altered sleeping 0 1 0  Tired, decreased energy 2 1 0  Change in appetite 0 0 0  Feeling bad or failure about yourself  0 0 0  Trouble concentrating 0 0 0  Moving slowly or fidgety/restless 0 0 0  Suicidal thoughts 0 0   PHQ-9 Score 4 2  1       Data saved with a previous flowsheet row definition        12/29/2023   11:00 AM 09/06/2023    3:30 PM  GAD 7 : Generalized Anxiety Score  Nervous, Anxious, on Edge 0  1   Control/stop worrying 1  0   Worry too much - different things 1  1   Trouble relaxing 0  0   Restless 0  0   Easily annoyed or irritable 1  1   Afraid - awful might happen 0  0   Total GAD 7 Score 3 3     Data saved with a previous flowsheet row definition      Review of Systems:   Pertinent items are noted in HPI Denies abnormal vaginal discharge w/ itching/odor/irritation, headaches, visual changes, shortness of breath, chest pain, abdominal pain, severe nausea/vomiting, or problems with urination or bowel movements unless otherwise stated above. Pertinent History Reviewed:  Reviewed past medical,surgical, social, obstetrical and family history.  Reviewed problem list, medications and allergies. Physical Assessment:   Vitals:   02/28/24 1423  BP:  104/70  Pulse: 97  Weight: 212 lb (96.2 kg)  Body mass index is 34.22 kg/m.        Physical Examination:   General appearance: Well appearing, and in no distress  Mental status: Alert, oriented to person, place, and time  Skin: Warm & dry  Cardiovascular: Normal heart rate noted  Respiratory: Normal respiratory effort, no distress  Abdomen: Soft, gravid, nontender  Pelvic: Cervical exam performed         Extremities: Edema: None  Fetal Status: Fetal Heart Rate (bpm): 125 Fundal Height: 35 cm Movement: Present    Chaperone: Aleck Blase No results found for this or any previous visit (from the past 24 hours).  Assessment & Plan:  1) Low-risk pregnancy G1P0 at [redacted]w[redacted]d with an Estimated Date of Delivery: 03/27/24   2) Cold sx> gave printed info/meds   Meds: No orders of the defined types were placed in this encounter.  Labs/procedures today: GBS, GC/CT, and SVE  Plan:  Continue routine obstetrical care  Next visit: prefers in person    Reviewed: Preterm labor symptoms and general obstetric precautions including but not limited to vaginal bleeding, contractions, leaking of fluid and fetal movement were reviewed in detail with the patient.  All questions were answered. Does have home bp cuff. Office bp cuff given: not applicable. Check bp weekly, let us  know if consistently >140 and/or >90.  Follow-up: Return for As scheduled.  Future Appointments  Date Time Provider Department Center  03/06/2024 11:50 AM Kizzie Suzen SAUNDERS, CNM [facility]  03/13/2024  9:50 AM Kizzie Suzen SAUNDERS, CNM [facility]  03/20/2024  9:50 AM Kizzie Suzen SAUNDERS, CNM [facility]  03/27/2024  9:50 AM Kizzie Suzen SAUNDERS, CNM [facility]    Orders Placed This Encounter  Procedures   Culture, beta strep (group b only)   Suzen SAUNDERS Kizzie CNM, [facility] 02/28/2024 3:05 PM  "

## 2024-02-28 NOTE — Patient Instructions (Addendum)
 Crystalle, thank you for choosing our office today! We appreciate the opportunity to meet your healthcare needs. You may receive a short survey by mail, e-mail, or through [employer]. If you are happy with your care we would appreciate if you could take just a few minutes to complete the survey questions. We read all of your comments and take your feedback very seriously. Thank you again for choosing our office.  Center for [employer] Team at [facility]  [facility] at [facility] ([address] [place], [place] [address]) Entrance C, located off of [address] Free 24/7 valet parking   Safe Medications in Pregnancy   Colds/Coughs/Allergies: Benadryl  (alcohol free) 25 mg every 6 hours as needed Breath right strips Claritin  Cepacol throat lozenges Chloraseptic throat spray Cold-Eeze- up to three times per day Cough drops, alcohol free Flonase  (by prescription only) Guaifenesin Mucinex Robitussin DM (plain only, alcohol free) Saline nasal spray/drops Sudafed (pseudoephedrine) & Actifed ** use only after [redacted] weeks gestation and if you do not have high blood pressure Tylenol Vicks Vaporub Zinc  lozenges Zyrtec       CLASSES: Go to [email or web address] to register for classes (childbirth, breastfeeding, waterbirth, infant CPR, daddy bootcamp, etc.)  Call the office [phone]) or go to [facility] if: You begin to have strong, frequent contractions Your water breaks.  Sometimes it is a big gush of fluid, sometimes it is just a trickle that keeps getting your panties wet or running down your legs You have vaginal bleeding.  It is normal to have a small amount of spotting if your cervix was checked.  You don't feel your baby moving like normal.  If you don't, get you something to eat and drink and lay down and focus on feeling your baby move.   If your baby is still not moving like normal, you should call the office or go to [facility].  Call the office  [phone]) or go to [facility] hospital for these signs of pre-eclampsia: Severe headache that does not go away with Tylenol Visual changes- seeing spots, double, blurred vision Pain under your right breast or upper abdomen that does not go away with Tums or heartburn medicine Nausea and/or vomiting Severe swelling in your hands, feet, and face   Tdap Vaccine It is recommended that you get the Tdap vaccine during the third trimester of EACH pregnancy to help protect your baby from getting pertussis (whooping cough) 27-36 weeks is the BEST time to do this so that you can pass the protection on to your baby. During pregnancy is better than after pregnancy, but if you are unable to get it during pregnancy it will be offered at the hospital.  You can get this vaccine with us , at the health department, your family doctor, or some local pharmacies Everyone who will be around your baby should also be up-to-date on their vaccines before the baby comes. Adults (who are not pregnant) only need 1 dose of Tdap during adulthood.   [facility] Pediatricians/Family Doctors [facility]): [address] [place]uba BROCKS, [phone]           [facility]: [address], [phone]                [facility]): [address], [phone] (call to ask if accepting patients) [facility] Department: [address], [place], [phone]    [facility] Pediatricians/Family Doctors [facility]): [phone] [address]  [address], [phone] [facility]: [address] [place], [phone] [facility]: [address], [phone]  [facility] Doctors  [facility]): [phone] [facility]: [address] [phone]   [facility] Doctors [facility]: [address] [phone]  [facility] Doctors  [facility]: [phone], [phone]  Home Blood Pressure Monitoring for Patients   Your provider has recommended that you check your blood pressure (BP) at least once a week at home. If you do not have a blood pressure cuff at home, one will be provided for you. Contact your provider if you have not received your monitor within 1 week.   Helpful Tips for Accurate Home Blood Pressure Checks  Don't smoke, exercise, or drink caffeine 30 minutes before checking your BP Use the restroom before checking your BP (a full bladder can raise your pressure) Relax in a comfortable upright chair Feet on the ground Left arm resting comfortably on a flat surface at the level of your heart Legs uncrossed Back supported Sit quietly and don't talk Place the cuff on your bare arm Adjust snuggly, so that only two fingertips can fit between your skin and the top of the cuff Check 2 readings separated by at least one minute Keep a log of your BP readings For a visual, please reference this diagram: http://ccnc.care/bpdiagram  Provider Name: [facility]     Phone: [phone]  Zone 1: ALL CLEAR  Continue to monitor your symptoms:  BP reading is less than 140 (top number) or less than 90 (bottom number)  No right upper stomach pain No headaches or seeing spots No feeling nauseated or throwing up No swelling in face and hands  Zone 2: CAUTION Call your doctor's office for any of the following:  BP reading is greater than 140 (top number) or greater than 90 (bottom number)  Stomach pain under your ribs in the middle or right side Headaches or seeing spots Feeling nauseated or throwing up Swelling in face and hands  Zone 3: EMERGENCY  Seek immediate medical care if you have any of the following:  BP reading is greater than160 (top number) or greater than 110 (bottom number) Severe headaches not improving with Tylenol Serious difficulty catching your breath Any worsening symptoms  from Zone 2  Preterm Labor and Birth Information  The normal length of a pregnancy is 39-41 weeks. Preterm labor is when labor starts before 37 completed weeks of pregnancy. What are the risk factors for preterm labor? Preterm labor is more likely to occur in women who: Have certain infections during pregnancy such as a bladder infection, sexually transmitted infection, or infection inside the uterus (chorioamnionitis). Have a shorter-than-normal cervix. Have gone into preterm labor before. Have had surgery on their cervix. Are younger than age 51 or older than age 35. Are African American. Are pregnant with twins or multiple babies (multiple gestation). Take street drugs or smoke while pregnant. Do not gain enough weight while pregnant. Became pregnant shortly after having been pregnant. What are the symptoms of preterm labor? Symptoms of preterm labor include: Cramps similar to those that can happen during a menstrual period. The cramps may happen with diarrhea. Pain in the abdomen or lower back. Regular uterine contractions that may feel like tightening of the abdomen. A feeling of increased pressure in the pelvis. Increased watery or bloody mucus discharge from the vagina. Water breaking (ruptured amniotic sac).  Why is it important to recognize signs of preterm labor? It is important to recognize signs of preterm labor because babies who are born prematurely may not be fully developed. This can put them at an increased risk for: Long-term (chronic) heart and lung problems. Difficulty immediately after birth with regulating body systems, including blood sugar, body temperature, heart rate, and breathing rate. Bleeding in the brain. Cerebral palsy. Learning difficulties. Death. These risks are highest for babies who are born before 34 weeks of pregnancy. How is preterm labor treated? Treatment depends on the length of your pregnancy, your condition, and the health of your baby. It  may involve: Having a stitch (suture) placed in your cervix to prevent your cervix from opening too early (cerclage). Taking or being given medicines, such as: Hormone medicines. These may be given early in pregnancy to help support the pregnancy. Medicine to stop contractions. Medicines to help mature the babys lungs. These may be prescribed if the risk of delivery is high. Medicines to prevent your baby from developing cerebral palsy. If the labor happens before 34 weeks of pregnancy, you may need to stay in the hospital. What should I do if I think I am in preterm labor? If you think that you are going into preterm labor, call your health care provider right away. How can I prevent preterm labor in future pregnancies? To increase your chance of having a full-term pregnancy: Do not use any tobacco products, such as cigarettes, chewing tobacco, and e-cigarettes. If you need help quitting, ask your health care provider. Do not use street drugs or medicines that have not been prescribed to you during your pregnancy. Talk with your health care provider before taking any herbal supplements, even if you have been taking them regularly. Make sure you gain a healthy amount of weight during your pregnancy. Watch for infection. If you think that you might have an infection, get it checked right away. Make sure to tell your health care provider if you have gone into preterm labor before. This information is not intended to replace advice given to you by your health care provider. Make sure you discuss any questions you have with your health care provider. Document Revised: 05/19/2018 Document Reviewed: 06/18/2015 Elsevier Patient Education  2020 [employer].

## 2024-02-29 ENCOUNTER — Encounter: Payer: Self-pay | Admitting: Women's Health

## 2024-03-01 LAB — CERVICOVAGINAL ANCILLARY ONLY
Chlamydia: NEGATIVE
Comment: NEGATIVE
Comment: NORMAL
Neisseria Gonorrhea: NEGATIVE

## 2024-03-02 LAB — CULTURE, BETA STREP (GROUP B ONLY): Strep Gp B Culture: POSITIVE — AB

## 2024-03-06 ENCOUNTER — Ambulatory Visit: Admitting: Women's Health

## 2024-03-06 ENCOUNTER — Encounter: Payer: Self-pay | Admitting: Women's Health

## 2024-03-06 VITALS — BP 116/78 | HR 76 | Wt 213.0 lb

## 2024-03-06 DIAGNOSIS — Z3A37 37 weeks gestation of pregnancy: Secondary | ICD-10-CM

## 2024-03-06 DIAGNOSIS — Z3403 Encounter for supervision of normal first pregnancy, third trimester: Secondary | ICD-10-CM

## 2024-03-06 NOTE — Patient Instructions (Signed)
 Miranda White, thank you for choosing our office today! We appreciate the opportunity to meet your healthcare needs. You may receive a short survey by mail, e-mail, or through [employer]. If you are happy with your care we would appreciate if you could take just a few minutes to complete the survey questions. We read all of your comments and take your feedback very seriously. Thank you again for choosing our office.  Center for [employer] Team at [facility]  [facility] at [facility] ([address] [place], [place] [address]) Entrance C, located off of [address] Free 24/7 valet parking   CLASSES: Go to [email or web address] to register for classes (childbirth, breastfeeding, waterbirth, infant CPR, daddy bootcamp, etc.)  Call the office [phone]) or go to [facility] if: You begin to have strong, frequent contractions Your water breaks.  Sometimes it is a big gush of fluid, sometimes it is just a trickle that keeps getting your panties wet or running down your legs You have vaginal bleeding.  It is normal to have a small amount of spotting if your cervix was checked.  You don't feel your baby moving like normal.  If you don't, get you something to eat and drink and lay down and focus on feeling your baby move.   If your baby is still not moving like normal, you should call the office or go to [facility].  Call the office [phone]) or go to [facility] hospital for these signs of pre-eclampsia: Severe headache that does not go away with Tylenol Visual changes- seeing spots, double, blurred vision Pain under your right breast or upper abdomen that does not go away with Tums or heartburn medicine Nausea and/or vomiting Severe swelling in your hands, feet, and face   [facility] Pediatricians/Family Doctors [facility]): [address] [place], [phone]           [facility]: [address], [phone]                 [facility]): [address], [phone] (call to ask if accepting patients) [facility] Department: [address], [place], [phone]    [facility] Pediatricians/Family Doctors [facility]): [address], [phone] [facility]: [address] [place], [phone] [facility]: [address], [phone]  [facility] Doctors  [facility]): [phone] [facility]: [address] [phone]   [facility] Doctors [facility]: [address], [phone]  [facility] Doctors  [facility]: [phone], [phone]  Home Blood Pressure Monitoring for Patients   Your provider has recommended that you check your blood pressure (BP) at least once a week at home. If you do not have a blood pressure cuff at home, one will be provided for you. Contact your provider if you have not received your monitor within 1 week.   Helpful Tips for Accurate Home Blood Pressure Checks  Don't smoke, exercise, or drink caffeine 30 minutes before checking your BP Use the restroom before checking your BP (a full bladder can raise your pressure) Relax in a comfortable upright chair Feet on the ground Left arm resting comfortably on a flat surface at the level of your heart Legs uncrossed Back supported Sit quietly and don't talk Place the cuff on your bare arm Adjust snuggly, so that only two fingertips  can fit between your skin and the top of the cuff Check 2 readings separated by at least one minute Keep a log of your BP readings For a visual, please reference this diagram: http://ccnc.care/bpdiagram  Provider Name: [facility]     Phone: [phone]  Zone 1: ALL CLEAR  Continue to monitor your symptoms:  BP reading is less than 140 (top number) or less than 90 (bottom number)  No right  upper stomach pain No headaches or seeing spots No feeling nauseated or throwing up No swelling in face and hands  Zone 2: CAUTION Call your doctor's office for any of the following:  BP reading is greater than 140 (top number) or greater than 90 (bottom number)  Stomach pain under your ribs in the middle or right side Headaches or seeing spots Feeling nauseated or throwing up Swelling in face and hands  Zone 3: EMERGENCY  Seek immediate medical care if you have any of the following:  BP reading is greater than160 (top number) or greater than 110 (bottom number) Severe headaches not improving with Tylenol Serious difficulty catching your breath Any worsening symptoms from Zone 2   Braxton Hicks Contractions Contractions of the uterus can occur throughout pregnancy, but they are not always a sign that you are in labor. You may have practice contractions called Braxton Hicks contractions. These false labor contractions are sometimes confused with true labor. What are Darol Irving contractions? Braxton Hicks contractions are tightening movements that occur in the muscles of the uterus before labor. Unlike true labor contractions, these contractions do not result in opening (dilation) and thinning of the cervix. Toward the end of pregnancy (32-34 weeks), Braxton Hicks contractions can happen more often and may become stronger. These contractions are sometimes difficult to tell apart from true labor because they can be very uncomfortable. You should not feel embarrassed if you go to the hospital with false labor. Sometimes, the only way to tell if you are in true labor is for your health care provider to look for changes in the cervix. The health care provider will do a physical exam and may monitor your contractions. If you are not in true labor, the exam should show that your cervix is not dilating and your water has not broken. If there are no other health problems associated with your  pregnancy, it is completely safe for you to be sent home with false labor. You may continue to have Braxton Hicks contractions until you go into true labor. How to tell the difference between true labor and false labor True labor Contractions last 30-70 seconds. Contractions become very regular. Discomfort is usually felt in the top of the uterus, and it spreads to the lower abdomen and low back. Contractions do not go away with walking. Contractions usually become more intense and increase in frequency. The cervix dilates and gets thinner. False labor Contractions are usually shorter and not as strong as true labor contractions. Contractions are usually irregular. Contractions are often felt in the front of the lower abdomen and in the groin. Contractions may go away when you walk around or change positions while lying down. Contractions get weaker and are shorter-lasting as time goes on. The cervix usually does not dilate or become thin. Follow these instructions at home:  Take over-the-counter and prescription medicines only as told by your health care provider. Keep up with your usual exercises and follow other instructions from your health care provider. Eat and drink lightly if you think  you are going into labor. If Braxton Hicks contractions are making you uncomfortable: Change your position from lying down or resting to walking, or change from walking to resting. Sit and rest in a tub of warm water. Drink enough fluid to keep your urine pale yellow. Dehydration may cause these contractions. Do slow and deep breathing several times an hour. Keep all follow-up prenatal visits as told by your health care provider. This is important. Contact a health care provider if: You have a fever. You have continuous pain in your abdomen. Get help right away if: Your contractions become stronger, more regular, and closer together. You have fluid leaking or gushing from your vagina. You pass  blood-tinged mucus (bloody show). You have bleeding from your vagina. You have low back pain that you never had before. You feel your babys head pushing down and causing pelvic pressure. Your baby is not moving inside you as much as it used to. Summary Contractions that occur before labor are called Braxton Hicks contractions, false labor, or practice contractions. Braxton Hicks contractions are usually shorter, weaker, farther apart, and less regular than true labor contractions. True labor contractions usually become progressively stronger and regular, and they become more frequent. Manage discomfort from [facility] contractions by changing position, resting in a warm bath, drinking plenty of water, or practicing deep breathing. This information is not intended to replace advice given to you by your health care provider. Make sure you discuss any questions you have with your health care provider. Document Revised: 01/07/2017 Document Reviewed: 06/10/2016 Elsevier Patient Education  2020 [employer].

## 2024-03-06 NOTE — Progress Notes (Signed)
 "   LOW-RISK PREGNANCY VISIT Patient name: Miranda White MRN [id number]  Date of birth: 06-Sep-2005 Chief Complaint:   Routine Prenatal Visit  History of Present Illness:   Miranda White is a 19 y.o. G1P0 female at [redacted]w[redacted]d with an Estimated Date of Delivery: 03/27/24 being seen today for ongoing management of a low-risk pregnancy.   Today she reports no complaints. Contractions: Not present.  .  Movement: Present. denies leaking of fluid.     12/29/2023   11:00 AM 09/06/2023    3:30 PM 04/20/2022    9:25 AM  Depression screen PHQ 2/9  Decreased Interest 2 0 0  Down, Depressed, Hopeless 0 0 1  PHQ - 2 Score 2 0 1  Altered sleeping 0 1 0  Tired, decreased energy 2 1 0  Change in appetite 0 0 0  Feeling bad or failure about yourself  0 0 0  Trouble concentrating 0 0 0  Moving slowly or fidgety/restless 0 0 0  Suicidal thoughts 0 0   PHQ-9 Score 4 2  1       Data saved with a previous flowsheet row definition        12/29/2023   11:00 AM 09/06/2023    3:30 PM  GAD 7 : Generalized Anxiety Score  Nervous, Anxious, on Edge 0  1   Control/stop worrying 1  0   Worry too much - different things 1  1   Trouble relaxing 0  0   Restless 0  0   Easily annoyed or irritable 1  1   Afraid - awful might happen 0  0   Total GAD 7 Score 3 3     Data saved with a previous flowsheet row definition      Review of Systems:   Pertinent items are noted in HPI Denies abnormal vaginal discharge w/ itching/odor/irritation, headaches, visual changes, shortness of breath, chest pain, abdominal pain, severe nausea/vomiting, or problems with urination or bowel movements unless otherwise stated above. Pertinent History Reviewed:  Reviewed past medical,surgical, social, obstetrical and family history.  Reviewed problem list, medications and allergies. Physical Assessment:   Vitals:   03/06/24 1119  BP: 116/78  Pulse: 76  Weight: 213 lb (96.6 kg)  Body mass index is 34.38  kg/m.        Physical Examination:   General appearance: Well appearing, and in no distress  Mental status: Alert, oriented to person, place, and time  Skin: Warm & dry  Cardiovascular: Normal heart rate noted  Respiratory: Normal respiratory effort, no distress  Abdomen: Soft, gravid, nontender  Pelvic: Cervical exam deferred         Extremities: Edema: None  Fetal Status: Fetal Heart Rate (bpm): 140 Fundal Height: 37 cm Movement: Present    Chaperone: N/A No results found for this or any previous visit (from the past 24 hours).  Assessment & Plan:  1) Low-risk pregnancy G1P0 at [redacted]w[redacted]d with an Estimated Date of Delivery: 03/27/24    Meds: No orders of the defined types were placed in this encounter.  Labs/procedures today: none  Plan:  Continue routine obstetrical care  Next visit: prefers in person    Reviewed: Term labor symptoms and general obstetric precautions including but not limited to vaginal bleeding, contractions, leaking of fluid and fetal movement were reviewed in detail with the patient.  All questions were answered. Does have home bp cuff. Office bp cuff given: not applicable. Check bp weekly, let us  know if consistently >  140 and/or >90.  Follow-up: Return for As scheduled.  Future Appointments  Date Time Provider Department Center  03/06/2024 11:50 AM Kizzie Suzen SAUNDERS, CNM [facility]  03/13/2024  9:50 AM Kizzie Suzen SAUNDERS, CNM [facility]  03/20/2024  9:50 AM Kizzie Suzen SAUNDERS, CNM [facility]  03/27/2024  9:50 AM Kizzie Suzen SAUNDERS, CNM [facility]    No orders of the defined types were placed in this encounter.  Suzen SAUNDERS Kizzie CNM, [facility] 03/06/2024 11:42 AM  "

## 2024-03-12 ENCOUNTER — Encounter: Payer: Self-pay | Admitting: *Deleted

## 2024-03-13 ENCOUNTER — Ambulatory Visit (INDEPENDENT_AMBULATORY_CARE_PROVIDER_SITE_OTHER): Admitting: Women's Health

## 2024-03-13 ENCOUNTER — Encounter: Payer: Self-pay | Admitting: Women's Health

## 2024-03-13 VITALS — BP 116/79 | HR 97 | Wt 215.4 lb

## 2024-03-13 DIAGNOSIS — Z3403 Encounter for supervision of normal first pregnancy, third trimester: Secondary | ICD-10-CM

## 2024-03-13 DIAGNOSIS — Z3A38 38 weeks gestation of pregnancy: Secondary | ICD-10-CM | POA: Diagnosis not present

## 2024-03-13 NOTE — Patient Instructions (Signed)
 Miranda White, thank you for choosing our office today! We appreciate the opportunity to meet your healthcare needs. You may receive a short survey by mail, e-mail, or through [employer]. If you are happy with your care we would appreciate if you could take just a few minutes to complete the survey questions. We read all of your comments and take your feedback very seriously. Thank you again for choosing our office.  Center for [employer] Team at [facility]  [facility] at [facility] ([address] [place], [place] [address]) Entrance C, located off of [address] Free 24/7 valet parking   CLASSES: Go to [email or web address] to register for classes (childbirth, breastfeeding, waterbirth, infant CPR, daddy bootcamp, etc.)  Call the office [phone]) or go to [facility] if: You begin to have strong, frequent contractions Your water breaks.  Sometimes it is a big gush of fluid, sometimes it is just a trickle that keeps getting your panties wet or running down your legs You have vaginal bleeding.  It is normal to have a small amount of spotting if your cervix was checked.  You don't feel your baby moving like normal.  If you don't, get you something to eat and drink and lay down and focus on feeling your baby move.   If your baby is still not moving like normal, you should call the office or go to [facility].  Call the office [phone]) or go to [facility] hospital for these signs of pre-eclampsia: Severe headache that does not go away with Tylenol Visual changes- seeing spots, double, blurred vision Pain under your right breast or upper abdomen that does not go away with Tums or heartburn medicine Nausea and/or vomiting Severe swelling in your hands, feet, and face   [facility] Pediatricians/Family Doctors [facility]): [address] [place], [phone]           [facility]: [address], [phone]                 [facility]): [address], [phone] (call to ask if accepting patients) [facility] Department: [address], [place], [phone]    [facility] Pediatricians/Family Doctors [facility]): [address], [phone] [facility]: [address] [place], [phone] [facility]: [address], [phone]  [facility] Doctors  [facility]): [phone] [facility]: [address] [phone]   [facility] Doctors [facility]: [address], [phone]  [facility] Doctors  [facility]: [phone], [phone]  Home Blood Pressure Monitoring for Patients   Your provider has recommended that you check your blood pressure (BP) at least once a week at home. If you do not have a blood pressure cuff at home, one will be provided for you. Contact your provider if you have not received your monitor within 1 week.   Helpful Tips for Accurate Home Blood Pressure Checks  Don't smoke, exercise, or drink caffeine 30 minutes before checking your BP Use the restroom before checking your BP (a full bladder can raise your pressure) Relax in a comfortable upright chair Feet on the ground Left arm resting comfortably on a flat surface at the level of your heart Legs uncrossed Back supported Sit quietly and don't talk Place the cuff on your bare arm Adjust snuggly, so that only two fingertips  can fit between your skin and the top of the cuff Check 2 readings separated by at least one minute Keep a log of your BP readings For a visual, please reference this diagram: http://ccnc.care/bpdiagram  Provider Name: [facility]     Phone: [phone]  Zone 1: ALL CLEAR  Continue to monitor your symptoms:  BP reading is less than 140 (top number) or less than 90 (bottom number)  No right  upper stomach pain No headaches or seeing spots No feeling nauseated or throwing up No swelling in face and hands  Zone 2: CAUTION Call your doctor's office for any of the following:  BP reading is greater than 140 (top number) or greater than 90 (bottom number)  Stomach pain under your ribs in the middle or right side Headaches or seeing spots Feeling nauseated or throwing up Swelling in face and hands  Zone 3: EMERGENCY  Seek immediate medical care if you have any of the following:  BP reading is greater than160 (top number) or greater than 110 (bottom number) Severe headaches not improving with Tylenol Serious difficulty catching your breath Any worsening symptoms from Zone 2   Braxton Hicks Contractions Contractions of the uterus can occur throughout pregnancy, but they are not always a sign that you are in labor. You may have practice contractions called Braxton Hicks contractions. These false labor contractions are sometimes confused with true labor. What are Darol Irving contractions? Braxton Hicks contractions are tightening movements that occur in the muscles of the uterus before labor. Unlike true labor contractions, these contractions do not result in opening (dilation) and thinning of the cervix. Toward the end of pregnancy (32-34 weeks), Braxton Hicks contractions can happen more often and may become stronger. These contractions are sometimes difficult to tell apart from true labor because they can be very uncomfortable. You should not feel embarrassed if you go to the hospital with false labor. Sometimes, the only way to tell if you are in true labor is for your health care provider to look for changes in the cervix. The health care provider will do a physical exam and may monitor your contractions. If you are not in true labor, the exam should show that your cervix is not dilating and your water has not broken. If there are no other health problems associated with your  pregnancy, it is completely safe for you to be sent home with false labor. You may continue to have Braxton Hicks contractions until you go into true labor. How to tell the difference between true labor and false labor True labor Contractions last 30-70 seconds. Contractions become very regular. Discomfort is usually felt in the top of the uterus, and it spreads to the lower abdomen and low back. Contractions do not go away with walking. Contractions usually become more intense and increase in frequency. The cervix dilates and gets thinner. False labor Contractions are usually shorter and not as strong as true labor contractions. Contractions are usually irregular. Contractions are often felt in the front of the lower abdomen and in the groin. Contractions may go away when you walk around or change positions while lying down. Contractions get weaker and are shorter-lasting as time goes on. The cervix usually does not dilate or become thin. Follow these instructions at home:  Take over-the-counter and prescription medicines only as told by your health care provider. Keep up with your usual exercises and follow other instructions from your health care provider. Eat and drink lightly if you think  you are going into labor. If Braxton Hicks contractions are making you uncomfortable: Change your position from lying down or resting to walking, or change from walking to resting. Sit and rest in a tub of warm water. Drink enough fluid to keep your urine pale yellow. Dehydration may cause these contractions. Do slow and deep breathing several times an hour. Keep all follow-up prenatal visits as told by your health care provider. This is important. Contact a health care provider if: You have a fever. You have continuous pain in your abdomen. Get help right away if: Your contractions become stronger, more regular, and closer together. You have fluid leaking or gushing from your vagina. You pass  blood-tinged mucus (bloody show). You have bleeding from your vagina. You have low back pain that you never had before. You feel your babys head pushing down and causing pelvic pressure. Your baby is not moving inside you as much as it used to. Summary Contractions that occur before labor are called Braxton Hicks contractions, false labor, or practice contractions. Braxton Hicks contractions are usually shorter, weaker, farther apart, and less regular than true labor contractions. True labor contractions usually become progressively stronger and regular, and they become more frequent. Manage discomfort from [facility] contractions by changing position, resting in a warm bath, drinking plenty of water, or practicing deep breathing. This information is not intended to replace advice given to you by your health care provider. Make sure you discuss any questions you have with your health care provider. Document Revised: 01/07/2017 Document Reviewed: 06/10/2016 Elsevier Patient Education  2020 [employer].

## 2024-03-20 ENCOUNTER — Encounter: Admitting: Women's Health

## 2024-03-27 ENCOUNTER — Encounter: Admitting: Women's Health
